# Patient Record
Sex: Female | Born: 1965 | Race: White | Hispanic: No | Marital: Married | Smoking: Never smoker
Health system: Southern US, Community
[De-identification: ages and names within clinical notes are randomized; demographics above are authoritative.]

## PROBLEM LIST (undated history)

## (undated) DIAGNOSIS — F419 Anxiety disorder, unspecified: Secondary | ICD-10-CM

## (undated) DIAGNOSIS — R7303 Prediabetes: Secondary | ICD-10-CM

## (undated) HISTORY — PX: TONSILLECTOMY: SUR1361

## (undated) HISTORY — PX: ADENOIDECTOMY: SUR15

## (undated) HISTORY — DX: Prediabetes: R73.03

## (undated) HISTORY — DX: Anxiety disorder, unspecified: F41.9

---

## 2017-11-21 DIAGNOSIS — L821 Other seborrheic keratosis: Secondary | ICD-10-CM | POA: Diagnosis not present

## 2017-11-21 DIAGNOSIS — L738 Other specified follicular disorders: Secondary | ICD-10-CM | POA: Diagnosis not present

## 2017-11-21 DIAGNOSIS — D2272 Melanocytic nevi of left lower limb, including hip: Secondary | ICD-10-CM | POA: Diagnosis not present

## 2017-11-21 DIAGNOSIS — D2262 Melanocytic nevi of left upper limb, including shoulder: Secondary | ICD-10-CM | POA: Diagnosis not present

## 2017-11-21 DIAGNOSIS — D492 Neoplasm of unspecified behavior of bone, soft tissue, and skin: Secondary | ICD-10-CM | POA: Diagnosis not present

## 2017-11-21 DIAGNOSIS — L570 Actinic keratosis: Secondary | ICD-10-CM | POA: Diagnosis not present

## 2017-12-17 DIAGNOSIS — F411 Generalized anxiety disorder: Secondary | ICD-10-CM | POA: Diagnosis not present

## 2017-12-17 DIAGNOSIS — J111 Influenza due to unidentified influenza virus with other respiratory manifestations: Secondary | ICD-10-CM | POA: Diagnosis not present

## 2018-04-11 DIAGNOSIS — J0111 Acute recurrent frontal sinusitis: Secondary | ICD-10-CM | POA: Diagnosis not present

## 2019-04-08 DIAGNOSIS — J302 Other seasonal allergic rhinitis: Secondary | ICD-10-CM | POA: Diagnosis not present

## 2019-04-08 DIAGNOSIS — F411 Generalized anxiety disorder: Secondary | ICD-10-CM | POA: Diagnosis not present

## 2019-04-21 DIAGNOSIS — Z1231 Encounter for screening mammogram for malignant neoplasm of breast: Secondary | ICD-10-CM | POA: Diagnosis not present

## 2019-04-21 DIAGNOSIS — Z0000 Encounter for general adult medical examination without abnormal findings: Secondary | ICD-10-CM | POA: Diagnosis not present

## 2019-04-21 DIAGNOSIS — Z124 Encounter for screening for malignant neoplasm of cervix: Secondary | ICD-10-CM | POA: Diagnosis not present

## 2019-04-21 DIAGNOSIS — Z6839 Body mass index (BMI) 39.0-39.9, adult: Secondary | ICD-10-CM | POA: Diagnosis not present

## 2019-04-21 DIAGNOSIS — Z3009 Encounter for other general counseling and advice on contraception: Secondary | ICD-10-CM | POA: Diagnosis not present

## 2019-04-21 DIAGNOSIS — Z01419 Encounter for gynecological examination (general) (routine) without abnormal findings: Secondary | ICD-10-CM | POA: Diagnosis not present

## 2019-05-08 DIAGNOSIS — Z01812 Encounter for preprocedural laboratory examination: Secondary | ICD-10-CM | POA: Diagnosis not present

## 2019-05-08 DIAGNOSIS — Z3202 Encounter for pregnancy test, result negative: Secondary | ICD-10-CM | POA: Diagnosis not present

## 2019-05-08 DIAGNOSIS — Z30430 Encounter for insertion of intrauterine contraceptive device: Secondary | ICD-10-CM | POA: Diagnosis not present

## 2019-05-08 DIAGNOSIS — Z113 Encounter for screening for infections with a predominantly sexual mode of transmission: Secondary | ICD-10-CM | POA: Diagnosis not present

## 2019-06-19 DIAGNOSIS — Z30431 Encounter for routine checking of intrauterine contraceptive device: Secondary | ICD-10-CM | POA: Diagnosis not present

## 2019-06-19 DIAGNOSIS — Z975 Presence of (intrauterine) contraceptive device: Secondary | ICD-10-CM | POA: Diagnosis not present

## 2019-06-19 DIAGNOSIS — N921 Excessive and frequent menstruation with irregular cycle: Secondary | ICD-10-CM | POA: Diagnosis not present

## 2019-06-24 DIAGNOSIS — N3946 Mixed incontinence: Secondary | ICD-10-CM | POA: Diagnosis not present

## 2019-07-16 ENCOUNTER — Other Ambulatory Visit: Payer: Self-pay

## 2019-07-16 DIAGNOSIS — Z20822 Contact with and (suspected) exposure to covid-19: Secondary | ICD-10-CM

## 2019-07-17 LAB — NOVEL CORONAVIRUS, NAA: SARS-CoV-2, NAA: NOT DETECTED

## 2019-10-15 ENCOUNTER — Encounter: Payer: Self-pay | Admitting: Family Medicine

## 2019-10-15 ENCOUNTER — Ambulatory Visit: Payer: BC Managed Care – PPO | Admitting: Family Medicine

## 2019-10-15 ENCOUNTER — Other Ambulatory Visit: Payer: Self-pay

## 2019-10-15 VITALS — BP 108/68 | HR 95 | Temp 96.2°F | Ht 66.5 in | Wt 244.4 lb

## 2019-10-15 DIAGNOSIS — Z23 Encounter for immunization: Secondary | ICD-10-CM | POA: Diagnosis not present

## 2019-10-15 DIAGNOSIS — F411 Generalized anxiety disorder: Secondary | ICD-10-CM | POA: Diagnosis not present

## 2019-10-15 DIAGNOSIS — J309 Allergic rhinitis, unspecified: Secondary | ICD-10-CM

## 2019-10-15 MED ORDER — FLUTICASONE PROPIONATE 50 MCG/ACT NA SUSP: 2.0000 | Freq: Every day | NASAL | 2 refills | Status: DC

## 2019-10-15 MED ORDER — VENLAFAXINE HCL ER 75 MG PO CP24: 225.0000 mg | ORAL_CAPSULE | Freq: Every day | ORAL | 2 refills | Status: DC

## 2019-10-15 MED ORDER — KETOTIFEN FUMARATE 0.025 % OP SOLN: 1.0000 [drp] | Freq: Two times a day (BID) | OPHTHALMIC | 2 refills | Status: DC

## 2019-10-15 NOTE — Progress Notes (Signed)
Chief Complaint  Patient presents with  . New Patient (Initial Visit)  . Eye Drainage    mouth and nose burns       New Patient Visit SUBJECTIVE: HPI: Darlene Odonnell is an 52 y.o.female who is being seen for establishing care.  The patient was previously seen at [facility] in [place].  Pt has a hx of GAD. She takes Effexor 225 mg/d. Tolerates well, reports compliance. Stressors seem to be associated with her job. She will think about coming off in the next 3-4 years when she retires. No SI or HI.   2 mo of burning in eyes, nose and sometimes mouth. Associated itchy eyes, posterior drainage from nose and dryness. She does take an antihistamine daily, Xyzal typically works best. She will have associated popping in her ears, but not pain or drainage. No sinus pain, but sometimes upper dental pain. Denies sob/wheezing or fevers.   Past Medical History:  Diagnosis Date  . Anxiety    Past Surgical History:  Procedure Laterality Date  . CESAREAN SECTION  09/30/1993  . CESAREAN SECTION  07/23/1997   Family History  Problem Relation Age of Onset  . Diabetes Mother   . Hypertension Mother   . Diabetes Father    No Known Allergies  Current Outpatient Medications:  .  levocetirizine (XYZAL) 5 MG tablet, Take 5 mg by mouth every evening., Disp: , Rfl:  .  venlafaxine XR (EFFEXOR-XR) 75 MG 24 hr capsule, Take 3 capsules (225 mg total) by mouth daily with breakfast., Disp: 270 capsule, Rfl: 2 .  fluticasone (FLONASE) 50 MCG/ACT nasal spray, Place 2 sprays into both nostrils daily., Disp: 16 g, Rfl: 2 .  ketotifen (ZADITOR) 0.025 % ophthalmic solution, Place 1 drop into both eyes 2 (two) times daily., Disp: 5 mL, Rfl: 2  ROS Const: Denies fevers  Respiratory: Denies dyspnea   OBJECTIVE: BP 108/68 (BP Location: Left Arm, Patient Position: Sitting, Cuff Size: Large)   Pulse 95   Temp (!) 96.2 F (35.7 C) (Temporal)   Ht 5' 6.5" (1.689 m)   Wt 244 lb 6 oz (110.8 kg)   SpO2 94%   BMI  38.85 kg/m  General:  well developed, well nourished, in no apparent distress Skin:  no significant moles, warts, or growths Head:  no masses, lesions, or tenderness Eyes:  pupils equal and round, sclera anicteric without injection Ears:  canals without lesions, TMs shiny without retraction, no obvious effusion, no erythema Nose:  nares patent, septum midline, mucosa normal Throat/Pharynx:  lips and gingiva without lesion; tongue and uvula midline; non-inflamed pharynx, there is some hyperemia of soft palate; no exudates or postnasal drainage Neck: neck supple without adenopathy, thyromegaly, or masses Lungs:  clear to auscultation, breath sounds equal bilaterally, no respiratory distress Cardio:  regular rate and rhythm, no LE edema or bruits Neuro:  gait normal; deep tendon reflexes normal and symmetric Psych: well oriented with normal range of affect and appropriate judgment/insight  ASSESSMENT/PLAN: GAD (generalized anxiety disorder) - Plan: venlafaxine XR (EFFEXOR-XR) 75 MG 24 hr capsule  Allergic rhinitis, unspecified seasonality, unspecified trigger - Plan: levocetirizine (XYZAL) 5 MG tablet, fluticasone (FLONASE) 50 MCG/ACT nasal spray, ketotifen (ZADITOR) 0.025 % ophthalmic solution  Need for influenza vaccination - Plan: Flu Vaccine QUAD 6+ mos PF IM (Fluarix Quad PF)  Patient instructed to sign release of records form from her previous PCP. 1-Cont Effexor 2- Cont Xyzal. Add INCS. Add antihistamine drops. Patient should return in 6 mo for CPE or prn.  The patient voiced understanding and agreement to the plan.   [name] Solomon, DO 10/15/19  12:51 PM

## 2019-10-15 NOTE — Patient Instructions (Signed)
Consider over the counter allergy eye drops or artificial tears.  Stay on the [place].   Let us know if you need anything.

## 2019-12-15 ENCOUNTER — Ambulatory Visit (INDEPENDENT_AMBULATORY_CARE_PROVIDER_SITE_OTHER): Payer: BC Managed Care – PPO | Admitting: Family

## 2019-12-15 ENCOUNTER — Encounter: Payer: Self-pay | Admitting: Family

## 2019-12-15 VITALS — Temp 98.7°F

## 2019-12-15 DIAGNOSIS — J329 Chronic sinusitis, unspecified: Secondary | ICD-10-CM

## 2019-12-15 DIAGNOSIS — J309 Allergic rhinitis, unspecified: Secondary | ICD-10-CM

## 2019-12-15 MED ORDER — AMOXICILLIN-POT CLAVULANATE 875-125 MG PO TABS: 1.0000 | ORAL_TABLET | Freq: Two times a day (BID) | ORAL | 0 refills | Status: DC

## 2019-12-15 MED ORDER — MONTELUKAST SODIUM 10 MG PO TABS: 10.0000 mg | ORAL_TABLET | Freq: Every day | ORAL | 3 refills | Status: AC

## 2019-12-15 NOTE — Progress Notes (Signed)
Virtual Visit via Video Note  I connected with Darlene Odonnell on 12/15/19 at  9:20 AM EST by a video enabled telemedicine application and verified that I am speaking with the correct person using two identifiers.  Location: Patient: home Provider: home   I discussed the limitations of evaluation and management by telemedicine and the availability of in person appointments. The patient expressed understanding and agreed to proceed.  History of Present Illness:  Patient is a 54 yr old female who presents with several symptoms. She reports that her nose burns, eyes burn, has frontal headache, post nasal drip.   +chills, no fever.  Mild sore throat occasionally that she attributes to drainage.  She denies loss of taste or smell. Denies significant cough.  She reports that this has been going on off/on since November.  She is a Oncologist.    She reports that she takes zyxal, occasional flonase, nasal saline.  Notes that nasal drainage is very thick.   Past Medical History:  Diagnosis Date  . Anxiety      Social History   Socioeconomic History  . Marital status: Married    Spouse name: Not on file  . Number of children: Not on file  . Years of education: Not on file  . Highest education level: Not on file  Occupational History  . Not on file  Tobacco Use  . Smoking status: Never Smoker  . Smokeless tobacco: Never Used  Substance and Sexual Activity  . Alcohol use: Yes  . Drug use: Never  . Sexual activity: Not on file  Other Topics Concern  . Not on file  Social History Narrative  . Not on file   Social Determinants of Health   Financial Resource Strain:   . Difficulty of Paying Living Expenses: Not on file  Food Insecurity:   . Worried About Charity fundraiser in the Last Year: Not on file  . Ran Out of Food in the Last Year: Not on file  Transportation Needs:   . Lack of Transportation (Medical): Not on file  . Lack of Transportation (Non-Medical): Not on  file  Physical Activity:   . Days of Exercise per Week: Not on file  . Minutes of Exercise per Session: Not on file  Stress:   . Feeling of Stress : Not on file  Social Connections:   . Frequency of Communication with Friends and Family: Not on file  . Frequency of Social Gatherings with Friends and Family: Not on file  . Attends Religious Services: Not on file  . Active Member of Clubs or Organizations: Not on file  . Attends Archivist Meetings: Not on file  . Marital Status: Not on file  Intimate Partner Violence:   . Fear of Current or Ex-Partner: Not on file  . Emotionally Abused: Not on file  . Physically Abused: Not on file  . Sexually Abused: Not on file    Past Surgical History:  Procedure Laterality Date  . CESAREAN SECTION  09/30/1993  . CESAREAN SECTION  07/23/1997    Family History  Problem Relation Age of Onset  . Diabetes Mother   . Hypertension Mother   . Diabetes Father     No Known Allergies  Current Outpatient Medications on File Prior to Visit  Medication Sig Dispense Refill  . fluticasone (FLONASE) 50 MCG/ACT nasal spray Place 2 sprays into both nostrils daily. 16 g 2  . ketotifen (ZADITOR) 0.025 % ophthalmic solution Place 1 drop  into both eyes 2 (two) times daily. 5 mL 2  . levocetirizine (XYZAL) 5 MG tablet Take 5 mg by mouth every evening.    . venlafaxine XR (EFFEXOR-XR) 75 MG 24 hr capsule Take 3 capsules (225 mg total) by mouth daily with breakfast. 270 capsule 2   No current facility-administered medications on file prior to visit.    Temp 98.7 F (37.1 C) (Tympanic)      Observations/Objective:   Gen: Awake, alert, no acute distress Resp: Breathing is even and non-labored Psych: calm/pleasant demeanor Neuro: Alert and Oriented x 3, + facial symmetry, speech is clear.   Assessment and Plan:  Allergic rhinitis-uncontrolled.  Continue Xyzal, and Flonase.  Recommended that she add trial of Singulair.  Sinusitis-we will  treat with Augmentin.She is advised to let us know if symptoms worsen or fail to improve.  Follow-up with PCP in 1 month  Follow Up Instructions:    I discussed the assessment and treatment plan with the patient. The patient was provided an opportunity to ask questions and all were answered. The patient agreed with the plan and demonstrated an understanding of the instructions.   The patient was advised to call back or seek an in-person evaluation if the symptoms worsen or if the condition fails to improve as anticipated.  Lemont Fillers, NP

## 2019-12-31 ENCOUNTER — Encounter: Payer: Self-pay | Admitting: Family Medicine

## 2019-12-31 ENCOUNTER — Other Ambulatory Visit: Payer: Self-pay

## 2019-12-31 ENCOUNTER — Ambulatory Visit (INDEPENDENT_AMBULATORY_CARE_PROVIDER_SITE_OTHER): Payer: BC Managed Care – PPO | Admitting: Family Medicine

## 2019-12-31 DIAGNOSIS — J40 Bronchitis, not specified as acute or chronic: Secondary | ICD-10-CM | POA: Diagnosis not present

## 2019-12-31 DIAGNOSIS — U071 COVID-19: Secondary | ICD-10-CM

## 2019-12-31 MED ORDER — ALBUTEROL SULFATE HFA 108 (90 BASE) MCG/ACT IN AERS: 2.0000 | INHALATION_SPRAY | Freq: Four times a day (QID) | RESPIRATORY_TRACT | 2 refills | Status: AC | PRN

## 2019-12-31 MED ORDER — PROMETHAZINE-DM 6.25-15 MG/5ML PO SYRP: 5.0000 mL | ORAL_SOLUTION | Freq: Four times a day (QID) | ORAL | 0 refills | Status: DC | PRN

## 2019-12-31 MED ORDER — AZITHROMYCIN 250 MG PO TABS: ORAL_TABLET | ORAL | 0 refills | Status: DC

## 2019-12-31 NOTE — Progress Notes (Signed)
Virtual Visit via Video Note  I connected with Darlene Odonnell on 12/31/19 at  8:20 AM EST by a video enabled telemedicine application and verified that I am speaking with the correct person using two identifiers.  Location: Patient: home  Provider: home    I discussed the limitations of evaluation and management by telemedicine and the availability of in person appointments. The patient expressed understanding and agreed to proceed.  History of Present Illness: Pt is home c/o cough and chest congestion and sob and heaviness in chest   She just finished the augmentin for sinusitis    Past Medical History:  Diagnosis Date  . Anxiety    Current Outpatient Medications on File Prior to Visit  Medication Sig Dispense Refill  . fluticasone (FLONASE) 50 MCG/ACT nasal spray Place 2 sprays into both nostrils daily. 16 g 2  . ketotifen (ZADITOR) 0.025 % ophthalmic solution Place 1 drop into both eyes 2 (two) times daily. 5 mL 2  . levocetirizine (XYZAL) 5 MG tablet Take 5 mg by mouth every evening.    . montelukast (SINGULAIR) 10 MG tablet Take 1 tablet (10 mg total) by mouth at bedtime. 30 tablet 3  . venlafaxine XR (EFFEXOR-XR) 75 MG 24 hr capsule Take 3 capsules (225 mg total) by mouth daily with breakfast. 270 capsule 2  . amoxicillin-clavulanate (AUGMENTIN) 875-125 MG tablet Take 1 tablet by mouth 2 (two) times daily. (Patient not taking: Reported on 12/31/2019) 20 tablet 0   No current facility-administered medications on file prior to visit.   No Known Allergies Social History   Socioeconomic History  . Marital status: Married    Spouse name: Not on file  . Number of children: Not on file  . Years of education: Not on file  . Highest education level: Not on file  Occupational History  . Not on file  Tobacco Use  . Smoking status: Never Smoker  . Smokeless tobacco: Never Used  Substance and Sexual Activity  . Alcohol use: Yes  . Drug use: Never  . Sexual activity: Not on file   Other Topics Concern  . Not on file  Social History Narrative  . Not on file   Social Determinants of Health   Financial Resource Strain:   . Difficulty of Paying Living Expenses: Not on file  Food Insecurity:   . Worried About Charity fundraiser in the Last Year: Not on file  . Ran Out of Food in the Last Year: Not on file  Transportation Needs:   . Lack of Transportation (Medical): Not on file  . Lack of Transportation (Non-Medical): Not on file  Physical Activity:   . Days of Exercise per Week: Not on file  . Minutes of Exercise per Session: Not on file  Stress:   . Feeling of Stress : Not on file  Social Connections:   . Frequency of Communication with Friends and Family: Not on file  . Frequency of Social Gatherings with Friends and Family: Not on file  . Attends Religious Services: Not on file  . Active Member of Clubs or Organizations: Not on file  . Attends Archivist Meetings: Not on file  . Marital Status: Not on file  Intimate Partner Violence:   . Fear of Current or Ex-Partner: Not on file  . Emotionally Abused: Not on file  . Physically Abused: Not on file  . Sexually Abused: Not on file   Observations/Objective: Unable to get vitals today She felt like she  had a fever last night   Assessment and Plan: 1. Bronchitis due to COVID-19 virus z pack, albuterol , cough med per orders con't singular , flonase and xyzal If not improving over the next few days we will schedule her in the resp clinic--- pt agrees with plan and will call back if she is not improving - azithromycin (ZITHROMAX Z-PAK) 250 MG tablet; As directed  Dispense: 6 each; Refill: 0 - albuterol (VENTOLIN HFA) 108 (90 Base) MCG/ACT inhaler; Inhale 2 puffs into the lungs every 6 (six) hours as needed for wheezing or shortness of breath.  Dispense: 18 g; Refill: 2 - promethazine-dextromethorphan (PROMETHAZINE-DM) 6.25-15 MG/5ML syrup; Take 5 mLs by mouth 4 (four) times daily as needed.   Dispense: 118 mL; Refill: 0   Follow Up Instructions:    I discussed the assessment and treatment plan with the patient. The patient was provided an opportunity to ask questions and all were answered. The patient agreed with the plan and demonstrated an understanding of the instructions.   The patient was advised to call back or seek an in-person evaluation if the symptoms worsen or if the condition fails to improve as anticipated.  I provided 30 minutes of non-face-to-face time during this encounter.--- reviewing chart / history    Donato Schultz, DO

## 2020-01-09 ENCOUNTER — Other Ambulatory Visit: Payer: Self-pay

## 2020-01-09 ENCOUNTER — Encounter (HOSPITAL_BASED_OUTPATIENT_CLINIC_OR_DEPARTMENT_OTHER): Payer: Self-pay | Admitting: *Deleted

## 2020-01-09 ENCOUNTER — Emergency Department (HOSPITAL_BASED_OUTPATIENT_CLINIC_OR_DEPARTMENT_OTHER)
Admission: EM | Admit: 2020-01-09 | Discharge: 2020-01-09 | Disposition: A | Discharge status: Home / Self Care | Payer: BC Managed Care – PPO | Attending: Emergency Medicine | Admitting: Emergency Medicine

## 2020-01-09 ENCOUNTER — Emergency Department (HOSPITAL_BASED_OUTPATIENT_CLINIC_OR_DEPARTMENT_OTHER): Payer: BC Managed Care – PPO

## 2020-01-09 DIAGNOSIS — R0789 Other chest pain: Secondary | ICD-10-CM | POA: Diagnosis not present

## 2020-01-09 DIAGNOSIS — R071 Chest pain on breathing: Secondary | ICD-10-CM | POA: Insufficient documentation

## 2020-01-09 DIAGNOSIS — R05 Cough: Secondary | ICD-10-CM | POA: Diagnosis not present

## 2020-01-09 DIAGNOSIS — U071 COVID-19: Secondary | ICD-10-CM | POA: Diagnosis not present

## 2020-01-09 DIAGNOSIS — R059 Cough, unspecified: Secondary | ICD-10-CM

## 2020-01-09 LAB — CBC
HCT: 41 % (ref 36.0–46.0)
Hemoglobin: 13 g/dL (ref 12.0–15.0)
MCH: 27.3 pg (ref 26.0–34.0)
MCHC: 31.7 g/dL (ref 30.0–36.0)
MCV: 86 fL (ref 80.0–100.0)
Platelets: 216 10*3/uL (ref 150–400)
RBC: 4.77 MIL/uL (ref 3.87–5.11)
RDW: 14.3 % (ref 11.5–15.5)
WBC: 4.4 10*3/uL (ref 4.0–10.5)
nRBC: 0 % (ref 0.0–0.2)

## 2020-01-09 LAB — BASIC METABOLIC PANEL
Anion gap: 6 (ref 5–15)
BUN: 15 mg/dL (ref 6–20)
CO2: 26 mmol/L (ref 22–32)
Calcium: 8.4 mg/dL — ABNORMAL LOW (ref 8.9–10.3)
Chloride: 104 mmol/L (ref 98–111)
Creatinine, Ser: 0.72 mg/dL (ref 0.44–1.00)
GFR calc Af Amer: >60 mL/min (ref >60)
GFR calc non Af Amer: >60 mL/min (ref >60)
Glucose, Bld: 111 mg/dL — ABNORMAL HIGH (ref 70–99)
Potassium: 3.7 mmol/L (ref 3.5–5.1)
Sodium: 136 mmol/L (ref 135–145)

## 2020-01-09 LAB — TROPONIN I (HIGH SENSITIVITY)
Troponin I (High Sensitivity): 3 ng/L (ref <18)
Troponin I (High Sensitivity): 2 ng/L (ref <18)

## 2020-01-09 LAB — D-DIMER, QUANTITATIVE: D-Dimer, Quant: 0.33 ug/mL-FEU (ref 0.00–0.50)

## 2020-01-09 MED ORDER — BENZONATATE 100 MG PO CAPS: 100.0000 mg | ORAL_CAPSULE | Freq: Three times a day (TID) | ORAL | 0 refills | Status: DC

## 2020-01-09 MED FILL — BENZONATATE 100 MG CAPS: 100 | 7 days supply | Qty: 21 | Fill #0

## 2020-01-09 NOTE — ED Triage Notes (Signed)
Covid positive x 10 days. States she still feels weak and has a cough. Painful to take a deep breath.

## 2020-01-09 NOTE — ED Notes (Signed)
Pt maintained O2 98-100% during ambulation.

## 2020-01-09 NOTE — Discharge Instructions (Addendum)
You have been diagnosed today with COVID-19 viral infection, chest wall pain.  At this time there does not appear to be the presence of an emergent medical condition, however there is always the potential for conditions to change. Please read and follow the below instructions.  Please return to the Emergency Department immediately for any new or worsening symptoms. Please be sure to follow up with your Primary Care Provider within one week regarding your visit today; please call their office to schedule an appointment even if you are feeling better for a follow-up visit. You may take the medication Tessalon as prescribed to help with your cough.  Please drink plenty of water and get plenty of rest.  Get help right away if: You have trouble breathing. You have pain or pressure in your chest. You have confusion. You have bluish lips and fingernails. You have difficulty waking from sleep. Get help right away if: Your chest pain is worse. You have a cough that gets worse, or you cough up blood. You have very bad (severe) pain in your belly (abdomen). You pass out (faint). You have either of these for no clear reason: Sudden chest discomfort. Sudden discomfort in your arms, back, neck, or jaw. You have shortness of breath at any time. You suddenly start to sweat, or your skin gets clammy. You feel sick to your stomach (nauseous). You throw up (vomit). You suddenly feel lightheaded or dizzy. You feel very weak or tired. Your heart starts to beat fast, or it feels like it is skipping beats. You have any new/concerning or worsening of symptoms  Please read the additional information packets attached to your discharge summary.  Do not take your medicine if  develop an itchy rash, swelling in your mouth or lips, or difficulty breathing; call 911 and seek immediate emergency medical attention if this occurs.  Note: Portions of this text may have been transcribed using voice recognition  software. Every effort was made to ensure accuracy; however, inadvertent computerized transcription errors may still be present.

## 2020-01-09 NOTE — ED Provider Notes (Signed)
[facility] EMERGENCY DEPARTMENT Provider Note   CSN: [id number] Arrival date & time: 01/09/20  1138     History Chief Complaint  Patient presents with   + Covid    Darlene Odonnell is a 54 y.o. female history of anxiety otherwise healthy no daily medication use.  Patient reports that she tested positive for COVID-19 at [facility] 10 days ago.  She was tested because she had developed a mild nonproductive cough and mild body aches that same day.  She reports that since onset of illness 10 days ago her symptoms have gradually worsened.  She reports a continued mild cough with minimal white sputum production.  She describes generalized body aches as a mild diffuse aching sensation to her muscles nonradiating constant without aggravating or alleviating factors or focal pain.  She reports generalized fatigue without unilateral weakness.  Additionally she reports 2 days ago she developed a mild central chest pain as a sharp sensation worsened with coughing and taking deep breath nonradiating improved when she stops coughing and intermittent.  Additionally patient reports that during the first few days of her illness she had mild nonbloody diarrhea which has resolved.  She denies any recent fever/chills, denies headache, vision changes, neck stiffness, hemoptysis, abdominal pain, nausea/vomiting, extremity swelling/color change or any additional concerns.  HPI     Past Medical History:  Diagnosis Date   Anxiety     Patient Active Problem List   Diagnosis Date Noted   GAD (generalized anxiety disorder) 10/15/2019    Past Surgical History:  Procedure Laterality Date   CESAREAN SECTION  09/30/1993   CESAREAN SECTION  07/23/1997     OB History   No obstetric history on file.     Family History  Problem Relation Age of Onset   Diabetes Mother    Hypertension Mother    Diabetes Father     Social History   Tobacco Use   Smoking status: Never Smoker   Smokeless  tobacco: Never Used  Substance Use Topics   Alcohol use: Yes   Drug use: Never    Home Medications Prior to Admission medications   Medication Sig Start Date End Date Taking? Authorizing Provider  albuterol (VENTOLIN HFA) 108 (90 Base) MCG/ACT inhaler Inhale 2 puffs into the lungs every 6 (six) hours as needed for wheezing or shortness of breath. 12/31/19   Donato Schultz, DO  amoxicillin-clavulanate (AUGMENTIN) 875-125 MG tablet Take 1 tablet by mouth 2 (two) times daily. Patient not taking: Reported on 12/31/2019 12/15/19   Sandford Craze, NP  azithromycin (ZITHROMAX Z-PAK) 250 MG tablet As directed 12/31/19   Zola Button, Grayling Congress, DO  benzonatate (TESSALON) 100 MG capsule Take 1 capsule (100 mg total) by mouth every 8 (eight) hours. 01/09/20   Harlene Salts A, PA-C  fluticasone (FLONASE) 50 MCG/ACT nasal spray Place 2 sprays into both nostrils daily. 10/15/19   Sharlene Dory, DO  ketotifen (ZADITOR) 0.025 % ophthalmic solution Place 1 drop into both eyes 2 (two) times daily. 10/15/19   Sharlene Dory, DO  levocetirizine (XYZAL) 5 MG tablet Take 5 mg by mouth every evening.    [provider]  montelukast (SINGULAIR) 10 MG tablet Take 1 tablet (10 mg total) by mouth at bedtime. 12/15/19   Sandford Craze, NP  promethazine-dextromethorphan (PROMETHAZINE-DM) 6.25-15 MG/5ML syrup Take 5 mLs by mouth 4 (four) times daily as needed. 12/31/19   Seabron Spates R, DO  venlafaxine XR (EFFEXOR-XR) 75 MG 24 hr capsule  Take 3 capsules (225 mg total) by mouth daily with breakfast. 10/15/19   Sharlene Dory, DO    Allergies    Patient has no known allergies.  Review of Systems   Review of Systems Ten systems are reviewed and are negative for acute change except as noted in the HPI  Physical Exam Updated Vital Signs BP 119/72    Pulse 86    Temp 98.2 F (36.8 C) (Oral)    Resp 18    Ht 5' 6.5" (1.689 m)    Wt 108.9 kg    SpO2 100%    BMI  38.16 kg/m   Physical Exam Constitutional:      General: She is not in acute distress.    Appearance: Normal appearance. She is well-developed. She is not ill-appearing or diaphoretic.  HENT:     Head: Normocephalic and atraumatic.     Right Ear: External ear normal.     Left Ear: External ear normal.     Nose: Nose normal.     Mouth/Throat:     Mouth: Mucous membranes are moist.     Pharynx: Oropharynx is clear.  Eyes:     General: Vision grossly intact. Gaze aligned appropriately.     Pupils: Pupils are equal, round, and reactive to light.  Neck:     Trachea: Trachea and phonation normal. No tracheal deviation.  Cardiovascular:     Rate and Rhythm: Normal rate and regular rhythm.  Pulmonary:     Effort: Pulmonary effort is normal. No respiratory distress.     Breath sounds: Normal breath sounds.  Chest:     Chest wall: Tenderness present. No deformity or crepitus.  Abdominal:     General: There is no distension.     Palpations: Abdomen is soft.     Tenderness: There is no abdominal tenderness. There is no guarding or rebound.  Musculoskeletal:        General: Normal range of motion.     Cervical back: Normal range of motion.     Right lower leg: No edema.     Left lower leg: No edema.  Skin:    General: Skin is warm and dry.  Neurological:     Mental Status: She is alert.     GCS: GCS eye subscore is 4. GCS verbal subscore is 5. GCS motor subscore is 6.     Comments: Speech is clear and goal oriented, follows commands Major Cranial nerves without deficit, no facial droop Moves extremities without ataxia, coordination intact  Psychiatric:        Behavior: Behavior normal.     ED Results / Procedures / Treatments   Labs (all labs ordered are listed, but only abnormal results are displayed) Labs Reviewed  BASIC METABOLIC PANEL - Abnormal; Notable for the following components:      Result Value   Glucose, Bld 111 (*)    Calcium 8.4 (*)    All other components  within normal limits  CBC  D-DIMER, QUANTITATIVE (NOT AT [facility])  TROPONIN I (HIGH SENSITIVITY)  TROPONIN I (HIGH SENSITIVITY)    EKG EKG Interpretation  Date/Time:  Friday January 09 2020 13:27:13 EST Ventricular Rate:  95 PR Interval:    QRS Duration: 89 QT Interval:  346 QTC Calculation: 435 R Axis:   53 Text Interpretation: Sinus rhythm Low voltage, precordial leads Baseline wander in lead(s) V3 Confirmed by Jacalyn Lefevre [phone]) on 01/09/2020 4:34:45 PM   Radiology DG Chest Portable 1 View  Result Date: 01/09/2020 CLINICAL DATA:  Chest tightness, COVID EXAM: PORTABLE CHEST 1 VIEW COMPARISON:  None. FINDINGS: Lungs are clear.  No pleural effusion or pneumothorax. The heart is normal in size. IMPRESSION: No evidence of acute cardiopulmonary disease. Electronically Signed   By: Julian Hy M.D.   On: 01/09/2020 12:54    Procedures Procedures (including critical care time)  Medications Ordered in ED Medications - No data to display  ED Course  I have reviewed the triage vital signs and the nursing notes.  Pertinent labs & imaging results that were available during my care of the patient were reviewed by me and considered in my medical decision making (see chart for details).    MDM Rules/Calculators/A&P                      54 year old female tested [facility], symptomatic for 10 days as well.  She presents today for chest pain which is reproducible on palpation the chest wall and worsened with cough ongoing x2 days.  On examination she is well-appearing and in no acute distress.  Cranial nerves intact, no meningeal signs, heart regular rate and rhythm, lungs clear to auscultation bilaterally, abdomen soft nontender without peritoneal signs and she is neurovascular intact to all 4 extremities without evidence of DVT.  Unfortunately cannot apply PERC criteria due to age, will obtain basic chest pain labs and a D-dimer as well.  Low  suspicion for PE at this time as patient is without tachycardia or hypoxia on room air. - CBC within normal limits, no signs of anemia or leukocytosis to suggest infection BMP without significant electrolyte abnormalities, edition no evidence of kidney injury D-dimer within normal limits, supports low suspicion for pulmonary embolism High-sensitivity troponin within normal limits x2, doubt ACS Chest x-ray:    IMPRESSION:  No evidence of acute cardiopulmonary disease.   I have personally reviewed patient's chest x-ray and agree with radiologist interpretation.  EKG: Sinus rhythm Low voltage, precordial leads Baseline wander in lead(s) V3 Confirmed by Isla Pence [phone]) on 01/09/2020 4:34:45 PM - Based on history, physical examination and work-up above aspect patient's sharp chest pain is secondary to musculoskeletal pain due to her cough.  She was ambulated by nursing staff and had no hypoxia or difficulty on room air.  There is no indication for admission or further work-up at this time.  No evidence of ACS, PE, dissection or other acute cardiopulmonary etiologies of her symptoms today.  Additionally no evidence to support a bacterial pneumonia requiring antibiotics at this time.  We will treat patient's cough, encourage home symptomatic treatments and PCP follow-up.  5:20 PM: Patient reevaluated resting comfortably using her phone.  Vital signs stable on room air without tachycardia or hypoxia.  She states understanding of work-up above and has no further questions, she reports that she is feeling well at this time.  She is agreeable to discharge with Ladona Ridgel and PCP follow-up.  I encouraged her to call their office to schedule a follow-up visit next week.  She is asking for a work note which has been provided.  At this time there does not appear to be any evidence of an acute emergency medical condition and the patient appears stable for discharge with appropriate outpatient follow  up. Diagnosis was discussed with patient who verbalizes understanding of care plan and is agreeable to discharge. I have discussed return precautions with patient who verbalizes understanding of return precautions.  Patient encouraged to follow-up with their PCP. All questions answered.  Patient's case discussed with Dr. Particia Nearing who agrees with plan to discharge with follow-up.   Briah Nary was evaluated in Emergency Department on 01/09/2020 for the symptoms described in the history of present illness. She was evaluated in the context of the global COVID-19 pandemic, which necessitated consideration that the patient might be at risk for infection with the SARS-CoV-2 virus that causes COVID-19. Institutional protocols and algorithms that pertain to the evaluation of patients at risk for COVID-19 are in a state of rapid change based on information released by regulatory bodies including the CDC and federal and state organizations. These policies and algorithms were followed during the patient's care in the ED.  Note: Portions of this report may have been transcribed using voice recognition software. Every effort was made to ensure accuracy; however, inadvertent computerized transcription errors may still be present. Final Clinical Impression(s) / ED Diagnoses Final diagnoses:  COVID-19 virus infection  Cough  Chest wall pain    Rx / DC Orders ED Discharge Orders         Ordered    benzonatate (TESSALON) 100 MG capsule  Every 8 hours     01/09/20 1728           Elizabeth Palau 01/09/20 1734    Jacalyn Lefevre, MD 01/09/20 1840

## 2020-03-18 DIAGNOSIS — T8332XA Displacement of intrauterine contraceptive device, initial encounter: Secondary | ICD-10-CM | POA: Diagnosis not present

## 2020-03-31 DIAGNOSIS — Z20828 Contact with and (suspected) exposure to other viral communicable diseases: Secondary | ICD-10-CM | POA: Diagnosis not present

## 2020-03-31 DIAGNOSIS — J0140 Acute pansinusitis, unspecified: Secondary | ICD-10-CM | POA: Diagnosis not present

## 2020-03-31 DIAGNOSIS — R05 Cough: Secondary | ICD-10-CM | POA: Diagnosis not present

## 2020-03-31 DIAGNOSIS — Z03818 Encounter for observation for suspected exposure to other biological agents ruled out: Secondary | ICD-10-CM | POA: Diagnosis not present

## 2020-04-26 DIAGNOSIS — T8332XD Displacement of intrauterine contraceptive device, subsequent encounter: Secondary | ICD-10-CM | POA: Diagnosis not present

## 2020-04-26 DIAGNOSIS — N921 Excessive and frequent menstruation with irregular cycle: Secondary | ICD-10-CM | POA: Diagnosis not present

## 2020-04-26 DIAGNOSIS — Z975 Presence of (intrauterine) contraceptive device: Secondary | ICD-10-CM | POA: Diagnosis not present

## 2020-04-26 DIAGNOSIS — T8332XA Displacement of intrauterine contraceptive device, initial encounter: Secondary | ICD-10-CM | POA: Diagnosis not present

## 2020-11-01 DIAGNOSIS — L821 Other seborrheic keratosis: Secondary | ICD-10-CM | POA: Diagnosis not present

## 2020-11-01 DIAGNOSIS — L820 Inflamed seborrheic keratosis: Secondary | ICD-10-CM | POA: Diagnosis not present

## 2020-11-01 DIAGNOSIS — D235 Other benign neoplasm of skin of trunk: Secondary | ICD-10-CM | POA: Diagnosis not present

## 2020-11-01 DIAGNOSIS — L281 Prurigo nodularis: Secondary | ICD-10-CM | POA: Diagnosis not present

## 2020-11-11 DIAGNOSIS — Z20822 Contact with and (suspected) exposure to covid-19: Secondary | ICD-10-CM | POA: Diagnosis not present

## 2020-12-02 ENCOUNTER — Telehealth: Payer: Self-pay | Admitting: Family Medicine

## 2020-12-02 DIAGNOSIS — F411 Generalized anxiety disorder: Secondary | ICD-10-CM

## 2020-12-02 MED ORDER — VENLAFAXINE HCL ER 75 MG PO CP24: 225.0000 mg | ORAL_CAPSULE | Freq: Every day | ORAL | 0 refills | Status: DC

## 2020-12-02 NOTE — Telephone Encounter (Signed)
Rx sent. Overdue for appt. Please schedule at her earliest convenience.

## 2020-12-02 NOTE — Telephone Encounter (Signed)
Medication: venlafaxine XR (EFFEXOR-XR) 75 MG 24 hr capsule [[id number]]       Has the patient contacted their pharmacy?  (If no, request that the patient contact the pharmacy for the refill.) (If yes, when and what did the pharmacy advise?)     Preferred Pharmacy (with phone number or street name): [id number] - [place], [place] - [address] AT [facility] [address]  [address], [place] [address]  Phone:  [phone] Fax:  [phone]      Agent: Please be advised that RX refills may take up to 3 business days. We ask that you follow-up with your pharmacy.

## 2020-12-19 ENCOUNTER — Other Ambulatory Visit: Payer: Self-pay | Admitting: Family Medicine

## 2020-12-19 DIAGNOSIS — F411 Generalized anxiety disorder: Secondary | ICD-10-CM

## 2020-12-22 ENCOUNTER — Ambulatory Visit: Payer: BC Managed Care – PPO | Admitting: Family Medicine

## 2020-12-31 ENCOUNTER — Encounter: Payer: Self-pay | Admitting: Family Medicine

## 2020-12-31 ENCOUNTER — Other Ambulatory Visit: Payer: Self-pay

## 2020-12-31 ENCOUNTER — Other Ambulatory Visit: Payer: Self-pay | Admitting: Family Medicine

## 2020-12-31 ENCOUNTER — Ambulatory Visit: Payer: BC Managed Care – PPO | Admitting: Family Medicine

## 2020-12-31 VITALS — BP 122/77 | HR 76 | Temp 98.6°F | Resp 18 | Wt 258.6 lb

## 2020-12-31 DIAGNOSIS — R0683 Snoring: Secondary | ICD-10-CM | POA: Diagnosis not present

## 2020-12-31 DIAGNOSIS — F411 Generalized anxiety disorder: Secondary | ICD-10-CM | POA: Diagnosis not present

## 2020-12-31 MED ORDER — VENLAFAXINE HCL ER 75 MG PO CP24: 225.0000 mg | ORAL_CAPSULE | Freq: Every day | ORAL | 2 refills | Status: DC

## 2020-12-31 MED ORDER — OZEMPIC (0.25 OR 0.5 MG/DOSE) 2 MG/1.5ML ~~LOC~~ SOPN: PEN_INJECTOR | SUBCUTANEOUS | 1 refills | Status: DC

## 2020-12-31 NOTE — Patient Instructions (Addendum)
Keep the diet clean and stay active.  Aim to do some physical exertion for 150 minutes per week. This is typically divided into 5 days per week, 30 minutes per day. The activity should be enough to get your heart rate up. Anything is better than nothing if you have time constraints.  Let me know if there are cost issues with the Ozempic (injection).   If you do not hear anything about your referral in the next 1-2 weeks, call our office and ask for an update.  Let us know if you need anything.

## 2020-12-31 NOTE — Progress Notes (Signed)
Chief Complaint  Patient presents with  . Medication Refill    Pt has noticed some weight gain d/t covid. She also says she feels bad and wants to explore why. She has cold feet sometime. She would like to know if she can take something to jumpstart weight loss    Subjective Darlene Odonnell presents for f/u anxiety.  Pt is currently being treated with Effexor XR 225 mg/d.  Reports doing OK since treatment. No thoughts of harming self or others. No self-medication with alcohol, prescription drugs or illicit drugs. Pt is not following with a counselor/psychologist.  Pt has gained around 30 lbs over the past year. This has been making her down. Her diet is fair. She is walking. She has done Weight Watchers in the past. She does snore and will wake up feeling poorly rested.   Past Medical History:  Diagnosis Date  . Anxiety    Allergies as of 12/31/2020      Reactions   Other    Other reaction(s): Unknown      Medication List       Accurate as of December 31, 2020  4:05 PM. If you have any questions, ask your nurse or doctor.        STOP taking these medications   amoxicillin-clavulanate 875-125 MG tablet Commonly known as: AUGMENTIN Stopped by: Sharlene Dory, DO   azithromycin 250 MG tablet Commonly known as: Zithromax Z-Pak Stopped by: Sharlene Dory, DO   benzonatate 100 MG capsule Commonly known as: TESSALON Stopped by: Sharlene Dory, DO   ketotifen 0.025 % ophthalmic solution Commonly known as: ZADITOR Stopped by: Sharlene Dory, DO   promethazine-dextromethorphan 6.25-15 MG/5ML syrup Commonly known as: PROMETHAZINE-DM Stopped by: Sharlene Dory, DO     TAKE these medications   albuterol 108 (90 Base) MCG/ACT inhaler Commonly known as: VENTOLIN HFA Inhale 2 puffs into the lungs every 6 (six) hours as needed for wheezing or shortness of breath.   fluticasone 50 MCG/ACT nasal spray Commonly known as: FLONASE Place 2  sprays into both nostrils daily.   levocetirizine 5 MG tablet Commonly known as: XYZAL Take 5 mg by mouth every evening.   Mirena (52 MG) 20 MCG/24HR IUD Generic drug: levonorgestrel by Intrauterine route.   montelukast 10 MG tablet Commonly known as: SINGULAIR Take 1 tablet (10 mg total) by mouth at bedtime.   Ozempic (0.25 or 0.5 MG/DOSE) 2 MG/1.5ML Sopn Generic drug: Semaglutide(0.25 or 0.5MG /DOS) Inject 0.25 mg into the skin once a week for 28 days, THEN 0.5 mg once a week for 28 days. Start taking on: December 31, 2020 Started by: Sharlene Dory, DO   venlafaxine XR 75 MG 24 hr capsule Commonly known as: EFFEXOR-XR Take 3 capsules (225 mg total) by mouth daily with breakfast.       Exam BP 122/77 (BP Location: Left Arm, Patient Position: Sitting, Cuff Size: Large)   Pulse 76   Temp 98.6 F (37 C) (Oral)   Resp 18   Wt 258 lb 9.6 oz (117.3 kg)   SpO2 98%   BMI 41.11 kg/m  General:  well developed, well nourished, in no apparent distress Heart: RRR Lungs:  CTAB. No respiratory distress Psych: well oriented with normal range of affect and age-appropriate judgement/insight, alert and oriented x4.  Assessment and Plan  GAD (generalized anxiety disorder) - Plan: venlafaxine XR (EFFEXOR-XR) 75 MG 24 hr capsule  Morbid obesity (HCC) - Plan: Semaglutide,0.25 or 0.5MG /DOS, (OZEMPIC, 0.25 OR 0.5  MG/DOSE,) 2 MG/1.5ML SOPN  Snoring - Plan: Ambulatory referral to Pulmonology  1. Cont Effexor XR 225 mg daily. 2. Trial Ozempic. She gets intermittent palpitations, will hold of on Adipex. Counseled on diet/exercise. 3. She probably has sleep apnea. Will refer to pulm.  F/u in 6 weeks for CPE. The patient voiced understanding and agreement to the plan.  Darlene Roche [place], DO 12/31/20 4:05 PM

## 2021-01-27 DIAGNOSIS — J0100 Acute maxillary sinusitis, unspecified: Secondary | ICD-10-CM | POA: Diagnosis not present

## 2021-02-11 ENCOUNTER — Encounter: Payer: Self-pay | Admitting: Family Medicine

## 2021-02-11 ENCOUNTER — Other Ambulatory Visit (INDEPENDENT_AMBULATORY_CARE_PROVIDER_SITE_OTHER): Payer: BC Managed Care – PPO

## 2021-02-11 ENCOUNTER — Ambulatory Visit (INDEPENDENT_AMBULATORY_CARE_PROVIDER_SITE_OTHER): Payer: BC Managed Care – PPO | Admitting: Family Medicine

## 2021-02-11 ENCOUNTER — Other Ambulatory Visit: Payer: Self-pay

## 2021-02-11 VITALS — BP 132/80 | HR 102 | Temp 98.5°F | Ht 67.0 in | Wt 257.0 lb

## 2021-02-11 DIAGNOSIS — R7303 Prediabetes: Secondary | ICD-10-CM | POA: Insufficient documentation

## 2021-02-11 DIAGNOSIS — Z1231 Encounter for screening mammogram for malignant neoplasm of breast: Secondary | ICD-10-CM | POA: Diagnosis not present

## 2021-02-11 DIAGNOSIS — J302 Other seasonal allergic rhinitis: Secondary | ICD-10-CM

## 2021-02-11 DIAGNOSIS — R739 Hyperglycemia, unspecified: Secondary | ICD-10-CM | POA: Diagnosis not present

## 2021-02-11 DIAGNOSIS — Z0000 Encounter for general adult medical examination without abnormal findings: Secondary | ICD-10-CM | POA: Diagnosis not present

## 2021-02-11 DIAGNOSIS — Z1211 Encounter for screening for malignant neoplasm of colon: Secondary | ICD-10-CM | POA: Diagnosis not present

## 2021-02-11 LAB — LIPID PANEL
Cholesterol: 169 mg/dL (ref 0–200)
HDL: 44.1 mg/dL (ref >39.00)
LDL Cholesterol: 107 mg/dL — ABNORMAL HIGH (ref 0–99)
NonHDL: 124.56
Total CHOL/HDL Ratio: 4
Triglycerides: 87 mg/dL (ref 0.0–149.0)
VLDL: 17.4 mg/dL (ref 0.0–40.0)

## 2021-02-11 LAB — CBC
HCT: 41.6 % (ref 36.0–46.0)
Hemoglobin: 13.5 g/dL (ref 12.0–15.0)
MCHC: 32.5 g/dL (ref 30.0–36.0)
MCV: 84.1 fl (ref 78.0–100.0)
Platelets: 251 10*3/uL (ref 150.0–400.0)
RBC: 4.95 Mil/uL (ref 3.87–5.11)
RDW: 14.6 % (ref 11.5–15.5)
WBC: 7.9 10*3/uL (ref 4.0–10.5)

## 2021-02-11 LAB — COMPREHENSIVE METABOLIC PANEL
ALT: 14 U/L (ref 0–35)
AST: 10 U/L (ref 0–37)
Albumin: 4.3 g/dL (ref 3.5–5.2)
Alkaline Phosphatase: 65 U/L (ref 39–117)
BUN: 13 mg/dL (ref 6–23)
CO2: 31 mEq/L (ref 19–32)
Calcium: 9.3 mg/dL (ref 8.4–10.5)
Chloride: 104 mEq/L (ref 96–112)
Creatinine, Ser: 0.75 mg/dL (ref 0.40–1.20)
GFR: 91.26 mL/min (ref >60.00)
Glucose, Bld: 119 mg/dL — ABNORMAL HIGH (ref 70–99)
Potassium: 4.5 mEq/L (ref 3.5–5.1)
Sodium: 141 mEq/L (ref 135–145)
Total Bilirubin: 0.5 mg/dL (ref 0.2–1.2)
Total Protein: 6.8 g/dL (ref 6.0–8.3)

## 2021-02-11 LAB — HEMOGLOBIN A1C: Hgb A1c MFr Bld: 6.2 % (ref 4.6–6.5)

## 2021-02-11 MED ORDER — METHYLPREDNISOLONE ACETATE 80 MG/ML IJ SUSP
80.0000 mg | Freq: Once | INTRAMUSCULAR | Status: AC
  Administered 2021-02-11: 80 mg via INTRAMUSCULAR

## 2021-02-11 NOTE — Progress Notes (Signed)
Chief Complaint  Patient presents with  . Annual Exam     Well Woman Darlene Odonnell is here for a complete physical.   Her last physical was >1 year ago.  Current diet: in general, diet could be better. Current exercise: walking. Weight is stable and she denies new fatigue out of ordinary.  Seatbelt? Yes Loss of interested in doing things or depression in past 2 weeks? No  Health Maintenance Pap/HPV- Yes Mammogram- No  Colon cancer screening-No Shingrix- No Tetanus- Yes Hep C screening- Yes HIV screening- Yes  Seasonal allergies Patient has a history of seasonal allergies.  She takes Singulair 10 mg daily, Xyzal 5 mg daily, and Flonase.  She is still having burning eyes, nasal complaints, and sometimes shortness of breath with activity.  She is wondering if she needs to see an allergist.  No fevers or sick contacts.  Past Medical History:  Diagnosis Date  . Anxiety      Past Surgical History:  Procedure Laterality Date  . CESAREAN SECTION  09/30/1993  . CESAREAN SECTION  07/23/1997    Medications  Current Outpatient Medications on File Prior to Visit  Medication Sig Dispense Refill  . albuterol (VENTOLIN HFA) 108 (90 Base) MCG/ACT inhaler Inhale 2 puffs into the lungs every 6 (six) hours as needed for wheezing or shortness of breath. 18 g 2  . fluticasone (FLONASE) 50 MCG/ACT nasal spray Place 2 sprays into both nostrils daily. 16 g 2  . levocetirizine (XYZAL) 5 MG tablet Take 5 mg by mouth every evening.    Marland Kitchen levonorgestrel (MIRENA, 52 MG,) 20 MCG/24HR IUD by Intrauterine route.    . montelukast (SINGULAIR) 10 MG tablet Take 1 tablet (10 mg total) by mouth at bedtime. 30 tablet 3  . Semaglutide,0.25 or 0.5MG /DOS, (OZEMPIC, 0.25 OR 0.5 MG/DOSE,) 2 MG/1.5ML SOPN Inject 0.25 mg into the skin once a week for 28 days, THEN 0.5 mg once a week for 28 days. 4.5 mL 1  . venlafaxine XR (EFFEXOR-XR) 75 MG 24 hr capsule Take 3 capsules (225 mg total) by mouth daily with breakfast.  270 capsule 2    Allergies Allergies  Allergen Reactions  . Other     Other reaction(s): Unknown    Review of Systems: Constitutional:  no unexpected weight changes Eye:  no recent significant change in vision Ear/Nose/Mouth/Throat:  Ears:  no recent change in hearing Nose/Mouth/Throat:  +runny nose, sinus congestion, burning around lips Cardiovascular: no chest pain Respiratory:  no current shortness of breath Gastrointestinal:  no abdominal pain, no change in bowel habits GU:  Female: negative for dysuria or pelvic pain Musculoskeletal/Extremities:  no pain of the joints Integumentary (Skin/Breast):  no abnormal skin lesions reported Neurologic:  no headaches Endocrine:  denies fatigue  Exam BP 132/80 (BP Location: Left Arm, Patient Position: Sitting, Cuff Size: Large)   Pulse (!) 102   Temp 98.5 F (36.9 C) (Oral)   Ht 5\' 7"  (1.702 m)   Wt 257 lb (116.6 kg)   SpO2 95%   BMI 40.25 kg/m  General:  well developed, well nourished, in no apparent distress Skin:  no significant moles, warts, or growths Head:  no masses, lesions, or tenderness Eyes:  pupils equal and round, sclera anicteric without injection Ears:  canals without lesions, TMs shiny without retraction, no obvious effusion, no erythema Nose:  nares patent, septum midline, mucosa normal, and no drainage or sinus tenderness Throat/Pharynx:  lips and gingiva without lesion; tongue and uvula midline; non-inflamed pharynx; no  exudates or postnasal drainage; there is hyperemia of the tonsillar pillars bilaterally Neck: neck supple without adenopathy, thyromegaly, or masses Lungs:  clear to auscultation, breath sounds equal bilaterally, no respiratory distress Cardio:  regular rate and rhythm, no LE edema Abdomen:  abdomen soft, nontender; bowel sounds normal; no masses or organomegaly Genital: Defer to GYN Musculoskeletal:  symmetrical muscle groups noted without atrophy or deformity Extremities:  no clubbing,  cyanosis, or edema, no deformities, no skin discoloration Neuro:  gait normal; deep tendon reflexes normal and symmetric Psych: well oriented with normal range of affect and appropriate judgment/insight  Assessment and Plan  Well adult exam - Plan: Comprehensive metabolic panel, Lipid panel, CBC  Encounter for screening mammogram for malignant neoplasm of breast - Plan: MM DIGITAL SCREENING BILATERAL  Screen for colon cancer - Plan: Ambulatory referral to Gastroenterology  Seasonal allergies - Plan: Ambulatory referral to Allergy, methylPREDNISolone acetate (DEPO-MEDROL) injection 80 mg   Well 55 y.o. female. Counseled on diet and exercise. Other orders as above. Depo injection today, continue Singulair 10 mg daily, Xyzal 5 mg daily, Flonase.  Refer to the allergy team. Follow up in 6 mo. The patient voiced understanding and agreement to the plan.  [name] [place], DO 02/11/21 9:36 AM

## 2021-02-11 NOTE — Patient Instructions (Addendum)
Give [place] 2-3 business days to get the results of your labs back.   Keep the diet clean and stay active.  If you do not hear anything about your referrals in the next 1-2 weeks, call our office and ask for an update.  Call your insurance company to see what weight loss medications they will cover.   The new Shingrix vaccine (for shingles) is a 2 shot series. It can make people feel low energy, achy and almost like they have the flu for 48 hours after injection. Please plan accordingly when deciding on when to get this shot. Call our office for a nurse visit appointment to get this. The second shot of the series is less severe regarding the side effects, but it still lasts 48 hours.   I do recommend you get the COVID-19 vaccination, either Pfizer or Moderna. Please don't hesitate to reach out if you have any questions.   Call Center for [facility] at [facility] at [phone] for an appointment.  They are located at [address], [place], [place], [address] (right across the hall from our office).  Let us know if you need anything.

## 2021-02-18 ENCOUNTER — Ambulatory Visit (HOSPITAL_BASED_OUTPATIENT_CLINIC_OR_DEPARTMENT_OTHER)
Admission: RE | Admit: 2021-02-18 | Discharge: 2021-02-18 | Disposition: A | Discharge status: Home / Self Care | Payer: BC Managed Care – PPO | Source: Ambulatory Visit | Attending: Family Medicine | Admitting: Family Medicine

## 2021-02-18 ENCOUNTER — Other Ambulatory Visit: Payer: Self-pay

## 2021-02-18 DIAGNOSIS — Z1231 Encounter for screening mammogram for malignant neoplasm of breast: Secondary | ICD-10-CM | POA: Diagnosis not present

## 2021-03-17 ENCOUNTER — Encounter: Payer: Self-pay | Admitting: Family

## 2021-03-17 ENCOUNTER — Ambulatory Visit: Payer: BC Managed Care – PPO | Admitting: Family

## 2021-03-17 ENCOUNTER — Other Ambulatory Visit: Payer: Self-pay

## 2021-03-17 VITALS — BP 110/76 | HR 68 | Temp 98.3°F | Ht 66.0 in | Wt 253.0 lb

## 2021-03-17 DIAGNOSIS — T7840XA Allergy, unspecified, initial encounter: Secondary | ICD-10-CM

## 2021-03-17 DIAGNOSIS — L03221 Cellulitis of neck: Secondary | ICD-10-CM | POA: Diagnosis not present

## 2021-03-17 MED ORDER — DOXYCYCLINE HYCLATE 100 MG PO TABS: 100.0000 mg | ORAL_TABLET | Freq: Two times a day (BID) | ORAL | 0 refills | Status: DC

## 2021-03-17 MED ORDER — TRIAMCINOLONE ACETONIDE 0.1 % EX CREA: 1.0000 "application " | TOPICAL_CREAM | Freq: Two times a day (BID) | CUTANEOUS | 0 refills | Status: DC

## 2021-03-17 MED ORDER — HYDROXYZINE HCL 10 MG PO TABS: 10.0000 mg | ORAL_TABLET | Freq: Three times a day (TID) | ORAL | 0 refills | Status: DC | PRN

## 2021-03-17 NOTE — Progress Notes (Signed)
Darlene Odonnell is a 55 y.o. female with the following history as recorded in [employer]:  Patient Active Problem List   Diagnosis Date Noted  . Prediabetes   . Morbid obesity (HCC) 12/31/2020  . GAD (generalized anxiety disorder) 10/15/2019    Current Outpatient Medications  Medication Sig Dispense Refill  . albuterol (VENTOLIN HFA) 108 (90 Base) MCG/ACT inhaler Inhale 2 puffs into the lungs every 6 (six) hours as needed for wheezing or shortness of breath. 18 g 2  . doxycycline (VIBRA-TABS) 100 MG tablet Take 1 tablet (100 mg total) by mouth 2 (two) times daily. 7 tablet 0  . fluticasone (FLONASE) 50 MCG/ACT nasal spray Place 2 sprays into both nostrils daily. 16 g 2  . hydrOXYzine (ATARAX/VISTARIL) 10 MG tablet Take 1 tablet (10 mg total) by mouth 3 (three) times daily as needed for itching. 30 tablet 0  . levocetirizine (XYZAL) 5 MG tablet Take 5 mg by mouth every evening.    [place] levonorgestrel (MIRENA, 52 MG,) 20 MCG/24HR IUD by Intrauterine route.    . triamcinolone cream (KENALOG) 0.1 % Apply 1 application topically 2 (two) times daily. 30 g 0  . venlafaxine XR (EFFEXOR-XR) 75 MG 24 hr capsule Take 3 capsules (225 mg total) by mouth daily with breakfast. 270 capsule 2  . montelukast (SINGULAIR) 10 MG tablet Take 1 tablet (10 mg total) by mouth at bedtime. (Patient not taking: Reported on 03/17/2021) 30 tablet 3   No current facility-administered medications for this visit.    Allergies: Other  Past Medical History:  Diagnosis Date  . Anxiety   . Prediabetes     Past Surgical History:  Procedure Laterality Date  . CESAREAN SECTION  09/30/1993  . CESAREAN SECTION  07/23/1997    Family History  Problem Relation Age of Onset  . Diabetes Mother   . Hypertension Mother   . Diabetes Father     Social History   Tobacco Use  . Smoking status: Never Smoker  . Smokeless tobacco: Never Used  Substance Use Topics  . Alcohol use: Yes    Subjective:   Presents with concerns for 1  day history of rash on neck; denies any new soaps, foods, detergents or medications.  Notes she was recently at [facility] in process of moving- has been cleaning out home/ packing boxes;    Objective:  Vitals:   03/17/21 0922  BP: 110/76  Pulse: 68  Temp: 98.3 F (36.8 C)  TempSrc: Oral  SpO2: 92%  Weight: 253 lb (114.8 kg)  Height: 5\' 6"  (1.676 m)    General: Well developed, well nourished, in no acute distress  Skin : Warm and dry. Localized area of erythema noted on left side of neck- puncture wound noted;  Head: Normocephalic and atraumatic  Eyes: Sclera and conjunctiva clear; pupils round and reactive to light; extraocular movements intact  Ears: External normal; canals clear; tympanic membranes normal  Oropharynx: Pink, supple. No suspicious lesions  Neck: Supple without thyromegaly, adenopathy  Lungs: Respirations unlabored;  Neurologic: Alert and oriented; speech intact; face symmetrical;   Assessment:  1. Cellulitis of neck   2. Allergic reaction, initial encounter     Plan:  Rx for Doxycyline 100 mg bid x 7 days; Rx for Atarax 10 mg- use this at night; okay to take Allegra during day; apply ice to affected area; Rx for Triamcinolone bid; follow-up worse, no better.  This visit occurred during the SARS-CoV-2 public health emergency.  Safety protocols were in place,  including screening questions prior to the visit, additional usage of staff PPE, and extensive cleaning of exam room while observing appropriate contact time as indicated for disinfecting solutions.     No follow-ups on file.  No orders of the defined types were placed in this encounter.   Requested Prescriptions   Signed Prescriptions Disp Refills  . doxycycline (VIBRA-TABS) 100 MG tablet 7 tablet 0    Sig: Take 1 tablet (100 mg total) by mouth 2 (two) times daily.  . hydrOXYzine (ATARAX/VISTARIL) 10 MG tablet 30 tablet 0    Sig: Take 1 tablet (10 mg total) by mouth 3 (three) times daily as  needed for itching.  . triamcinolone cream (KENALOG) 0.1 % 30 g 0    Sig: Apply 1 application topically 2 (two) times daily.

## 2021-04-04 ENCOUNTER — Other Ambulatory Visit: Payer: Self-pay

## 2021-04-04 ENCOUNTER — Encounter: Payer: Self-pay | Admitting: Allergy

## 2021-04-04 ENCOUNTER — Ambulatory Visit: Payer: BC Managed Care – PPO | Admitting: Allergy

## 2021-04-04 VITALS — BP 118/82 | HR 91 | Temp 97.7°F | Resp 18 | Ht 65.5 in | Wt 255.4 lb

## 2021-04-04 DIAGNOSIS — J3089 Other allergic rhinitis: Secondary | ICD-10-CM | POA: Diagnosis not present

## 2021-04-04 DIAGNOSIS — J4520 Mild intermittent asthma, uncomplicated: Secondary | ICD-10-CM | POA: Diagnosis not present

## 2021-04-04 MED ORDER — FLUTICASONE PROPIONATE 50 MCG/ACT NA SUSP: 1.0000 | Freq: Two times a day (BID) | NASAL | 5 refills | Status: AC | PRN

## 2021-04-04 MED ORDER — AZELASTINE HCL 0.1 % NA SOLN: 1.0000 | Freq: Two times a day (BID) | NASAL | 5 refills | Status: AC | PRN

## 2021-04-04 NOTE — Patient Instructions (Addendum)
Rhinitis:  . May use over the counter antihistamines such as Zyrtec (cetirizine), Claritin (loratadine), Allegra (fexofenadine), or Xyzal (levocetirizine) daily as needed. May take twice a day if needed during flares. . Continue Singulair (montelukast) 10mg  daily at night. . May use Flonase (fluticasone) nasal spray 1 spray per nostril twice a day as needed for nasal congestion.  . May use azelastine nasal spray 1-2 sprays per nostril twice a day as needed for runny nose/drainage. . Nasal saline spray (i.e., Simply Saline) or nasal saline lavage (i.e., NeilMed) is recommended as needed and prior to medicated nasal sprays. Keep track of infections.   . Get bloodwork:  o We are ordering labs, so please allow 1-2 weeks for the results to come back. o With the newly implemented Cures Act, the labs might be visible to you at the same time that they become visible to me. However, I will not address the results until all of the results are back, so please be patient.  o In the meantime, continue recommendations in your patient instructions, including avoidance measures (if applicable), until you hear from me.  Breathing:  Normal breathing test today.   May use albuterol rescue inhaler 2 puffs every 4 to 6 hours as needed for shortness of breath, chest tightness, coughing, and wheezing. May use albuterol rescue inhaler 2 puffs 5 to 15 minutes prior to strenuous physical activities. Monitor frequency of use.   Follow up in 2 months or sooner if needed.

## 2021-04-04 NOTE — Progress Notes (Signed)
New Patient Note  RE: Darlene Odonnell MRN: [id number] DOB: 1966/04/25 Date of Office Visit: 04/04/2021  Consult requested by: Sharlene Dory* Primary care provider: Sharlene Dory, DO  Chief Complaint: Allergic Rhinitis  (For years have been fighting sinus infections. Taking allergy medicatons. Mask causes irritation. Post nasal drip)  History of Present Illness: I had the pleasure of seeing Darlene Odonnell for initial evaluation at the [facility] on 04/05/2021. She is a 55 y.o. female, who is referred here by Sharlene Dory, DO for the evaluation of seasonal allergies.  She reports symptoms of eye/nose burning, itchy/watery eyes, sneezing, nasal congestion, PND, minimal rhinorrhea. Symptoms have been going on for 20+ years but worsening the past 2 years. The symptoms are present all year around with worsening in the fall and March. Other triggers include exposure to unknown but wearing masks make it worse. Anosmia: no. Headache: yes. She has used zyrtec, Claritin, Xyzal, allegra, Flonase with fair improvement in symptoms. Sinus infections: yes - usually 2-3 per year. Previous work up includes: none. Previous ENT evaluation: no. Previous sinus imaging: no. History of nasal polyps: no. Last eye exam: about 1+ year ago. History of reflux: sometimes. Moving to [place] in June.  Assessment and Plan: Darlene Odonnell is a 55 y.o. female with: Other allergic rhinitis Perennial rhinoconjunctivitis symptoms for the last 20+ years with worsening in the fall and spring.  Usually gets 2-3 sinus infections per year.  No prior allergy/ENT evaluation.  Tried Zyrtec, Claritin, Xyzal, Allegra and Flonase with some benefit.  Unable to skin test today due to recent antihistamine intake and patient does not think she can stop antihistamines for skin testing.  Get bloodwork and will make additional recommendations based on results.  . May use over the counter  antihistamines such as Zyrtec (cetirizine), Claritin (loratadine), Allegra (fexofenadine), or Xyzal (levocetirizine) daily as needed. May take twice a day if needed during flares. . Continue Singulair (montelukast) 10mg  daily at night. . May use Flonase (fluticasone) nasal spray 1 spray per nostril twice a day as needed for nasal congestion.  . May use azelastine nasal spray 1-2 sprays per nostril twice a day as needed for runny nose/drainage. . Nasal saline spray (i.e., Simply Saline) or nasal saline lavage (i.e., NeilMed) is recommended as needed and prior to medicated nasal sprays. Keep track of sinus infections.  . Patient moving to [place] in June.   Reactive airway disease Coughing, shortness of breath and wheezing with exertion and with bronchitis.  Takes albuterol as needed with good benefit.  Today's spirometry was normal.  May use albuterol rescue inhaler 2 puffs every 4 to 6 hours as needed for shortness of breath, chest tightness, coughing, and wheezing. May use albuterol rescue inhaler 2 puffs 5 to 15 minutes prior to strenuous physical activities. Monitor frequency of use.   Return in about 2 months (around 06/04/2021).  Meds ordered this encounter  Medications  . fluticasone (FLONASE) 50 MCG/ACT nasal spray    Sig: Place 1 spray into both nostrils 2 (two) times daily as needed (nasal congestion).    Dispense:  16 g    Refill:  5  . azelastine (ASTELIN) 0.1 % nasal spray    Sig: Place 1-2 sprays into both nostrils 2 (two) times daily as needed (runny nose, nasal drainage). Use in each nostril as directed    Dispense:  30 mL    Refill:  5    Lab Orders  Allergens w/Total IgE Area 2  Other allergy screening: Asthma: yes  Coughing and shortness of breath mainly with exertion - takes albuterol prn with good benefit. Wheezing during bronchitis.   Food allergy: no Medication allergy: no Hymenoptera allergy: large localized reactions Urticaria: in the past with no  known triggers Eczema:no History of recurrent infections suggestive of immunodeficency: no  Diagnostics: Spirometry:  Tracings reviewed. Her effort: Good reproducible efforts. FVC: 3.34L FEV1: 2.73L, 96% predicted FEV1/FVC ratio: 82% Interpretation: Spirometry consistent with normal pattern.  Please see scanned spirometry results for details.  Skin Testing: Unable to skin test today due to recent antihistamine intake.   Past Medical History: Patient Active Problem List   Diagnosis Date Noted  . Other allergic rhinitis 04/05/2021  . Reactive airway disease 04/05/2021  . Prediabetes   . Morbid obesity (HCC) 12/31/2020  . GAD (generalized anxiety disorder) 10/15/2019   Past Medical History:  Diagnosis Date  . Anxiety   . Prediabetes    Past Surgical History: Past Surgical History:  Procedure Laterality Date  . ADENOIDECTOMY    . CESAREAN SECTION  09/30/1993  . CESAREAN SECTION  07/23/1997  . TONSILLECTOMY     Medication List:  Current Outpatient Medications  Medication Sig Dispense Refill  . albuterol (VENTOLIN HFA) 108 (90 Base) MCG/ACT inhaler Inhale 2 puffs into the lungs every 6 (six) hours as needed for wheezing or shortness of breath. 18 g 2  . azelastine (ASTELIN) 0.1 % nasal spray Place 1-2 sprays into both nostrils 2 (two) times daily as needed (runny nose, nasal drainage). Use in each nostril as directed 30 mL 5  . fluticasone (FLONASE) 50 MCG/ACT nasal spray Place 1 spray into both nostrils 2 (two) times daily as needed (nasal congestion). 16 g 5  . levocetirizine (XYZAL) 5 MG tablet Take 5 mg by mouth every evening.    [place] levonorgestrel (MIRENA, 52 MG,) 20 MCG/24HR IUD by Intrauterine route.    . montelukast (SINGULAIR) 10 MG tablet Take 1 tablet (10 mg total) by mouth at bedtime. 30 tablet 3  . venlafaxine XR (EFFEXOR-XR) 75 MG 24 hr capsule Take 3 capsules (225 mg total) by mouth daily with breakfast. 270 capsule 2   No current facility-administered  medications for this visit.   Allergies: Allergies  Allergen Reactions  . Other     Other reaction(s): Unknown   Social History: Social History   Socioeconomic History  . Marital status: Married    Spouse name: Not on file  . Number of children: Not on file  . Years of education: Not on file  . Highest education level: Not on file  Occupational History  . Not on file  Tobacco Use  . Smoking status: Never Smoker  . Smokeless tobacco: Never Used  Substance and Sexual Activity  . Alcohol use: Yes  . Drug use: Never  . Sexual activity: Yes  Other Topics Concern  . Not on file  Social History Narrative  . Not on file   Social Determinants of Health   Financial Resource Strain: Not on file  Food Insecurity: Not on file  Transportation Needs: Not on file  Physical Activity: Not on file  Stress: Not on file  Social Connections: Not on file   Lives in a 53 year old house. Smoking: denies Occupation: Press photographer HistorySurveyor, minerals in the house: no Carpet in the family room: yes Carpet in the bedroom: yes Heating: gas Cooling: central Pet: yes 4 dogs x 7 yrs  Family History: Family History  Problem Relation Age of Onset  . Diabetes Mother   . Hypertension Mother   . Diabetes Father    Problem                               Relation Asthma                                   No  Eczema                                No  Food allergy                          No  Allergic rhino conjunctivitis     No   Review of Systems  Constitutional: Negative for appetite change, chills, fever and unexpected weight change.  HENT: Positive for congestion, postnasal drip, rhinorrhea and sneezing.   Eyes: Positive for itching.  Respiratory: Negative for cough, chest tightness, shortness of breath and wheezing.   Cardiovascular: Negative for chest pain.  Gastrointestinal: Negative for abdominal pain.  Genitourinary: Negative for difficulty  urinating.  Skin: Negative for rash.  Neurological: Positive for headaches.   Objective: BP 118/82   Pulse 91   Temp 97.7 F (36.5 C)   Resp 18   Ht 5' 5.5" (1.664 m)   Wt 255 lb 6.4 oz (115.8 kg)   SpO2 96%   BMI 41.85 kg/m  Body mass index is 41.85 kg/m. Physical Exam Vitals and nursing note reviewed.  Constitutional:      Appearance: Normal appearance. She is well-developed.  HENT:     Head: Normocephalic and atraumatic.     Right Ear: Tympanic membrane and external ear normal.     Left Ear: Tympanic membrane and external ear normal.     Nose: Nose normal.     Mouth/Throat:     Mouth: Mucous membranes are moist.     Pharynx: Oropharynx is clear.  Eyes:     Conjunctiva/sclera: Conjunctivae normal.  Cardiovascular:     Rate and Rhythm: Normal rate and regular rhythm.     Heart sounds: Normal heart sounds. No murmur heard. No friction rub. No gallop.   Pulmonary:     Effort: Pulmonary effort is normal.     Breath sounds: Normal breath sounds. No wheezing, rhonchi or rales.  Musculoskeletal:     Cervical back: Neck supple.  Skin:    General: Skin is warm.     Findings: No rash.  Neurological:     Mental Status: She is alert and oriented to person, place, and time.  Psychiatric:        Behavior: Behavior normal.    The plan was reviewed with the patient/family, and all questions/concerned were addressed.  It was my pleasure to see Darlene Odonnell today and participate in her care. Please feel free to contact me with any questions or concerns.  Sincerely,  Wyline Mood, DO Allergy & Immunology  [facility] office: [phone] [facility] office: [phone]

## 2021-04-05 DIAGNOSIS — J45909 Unspecified asthma, uncomplicated: Secondary | ICD-10-CM | POA: Insufficient documentation

## 2021-04-05 DIAGNOSIS — J3089 Other allergic rhinitis: Secondary | ICD-10-CM | POA: Insufficient documentation

## 2021-04-05 NOTE — Assessment & Plan Note (Signed)
Perennial rhinoconjunctivitis symptoms for the last 20+ years with worsening in the fall and spring.  Usually gets 2-3 sinus infections per year.  No prior allergy/ENT evaluation.  Tried Zyrtec, Claritin, Xyzal, Allegra and Flonase with some benefit.  Unable to skin test today due to recent antihistamine intake and patient does not think she can stop antihistamines for skin testing.  Get bloodwork and will make additional recommendations based on results.  . May use over the counter antihistamines such as Zyrtec (cetirizine), Claritin (loratadine), Allegra (fexofenadine), or Xyzal (levocetirizine) daily as needed. May take twice a day if needed during flares. . Continue Singulair (montelukast) 10mg  daily at night. . May use Flonase (fluticasone) nasal spray 1 spray per nostril twice a day as needed for nasal congestion.  . May use azelastine nasal spray 1-2 sprays per nostril twice a day as needed for runny nose/drainage. . Nasal saline spray (i.e., Simply Saline) or nasal saline lavage (i.e., NeilMed) is recommended as needed and prior to medicated nasal sprays. Keep track of sinus infections.  . Patient moving to [place] in June.

## 2021-04-05 NOTE — Assessment & Plan Note (Signed)
Coughing, shortness of breath and wheezing with exertion and with bronchitis.  Takes albuterol as needed with good benefit.  Today's spirometry was normal.  May use albuterol rescue inhaler 2 puffs every 4 to 6 hours as needed for shortness of breath, chest tightness, coughing, and wheezing. May use albuterol rescue inhaler 2 puffs 5 to 15 minutes prior to strenuous physical activities. Monitor frequency of use.

## 2021-04-11 LAB — ALLERGENS W/TOTAL IGE AREA 2
Alternaria Alternata IgE: <0.1 kU/L
Aspergillus Fumigatus IgE: <0.1 kU/L
Bermuda Grass IgE: <0.1 kU/L
Cat Dander IgE: 0.14 kU/L — AB
Cedar, Mountain IgE: <0.1 kU/L
Cladosporium Herbarum IgE: <0.1 kU/L
Cockroach, German IgE: <0.1 kU/L
Common Silver Birch IgE: 0.67 kU/L — AB
Cottonwood IgE: <0.1 kU/L
D Farinae IgE: <0.1 kU/L
D Pteronyssinus IgE: <0.1 kU/L
Dog Dander IgE: 1.91 kU/L — AB
Elm, American IgE: <0.1 kU/L
IgE (Immunoglobulin E), Serum: 51 IU/mL (ref 6–495)
Johnson Grass IgE: <0.1 kU/L
Maple/Box Elder IgE: <0.1 kU/L
Mouse Urine IgE: <0.1 kU/L
Oak, White IgE: <0.1 kU/L
Pecan, Hickory IgE: <0.1 kU/L
Penicillium Chrysogen IgE: <0.1 kU/L
Pigweed, Rough IgE: <0.1 kU/L
Ragweed, Short IgE: <0.1 kU/L
Sheep Sorrel IgE Qn: <0.1 kU/L
Timothy Grass IgE: <0.1 kU/L
White Mulberry IgE: <0.1 kU/L

## 2021-04-27 ENCOUNTER — Ambulatory Visit: Payer: BC Managed Care – PPO | Admitting: Family Medicine

## 2021-05-25 DIAGNOSIS — Z021 Encounter for pre-employment examination: Secondary | ICD-10-CM | POA: Diagnosis not present

## 2021-07-29 DIAGNOSIS — R030 Elevated blood-pressure reading, without diagnosis of hypertension: Secondary | ICD-10-CM | POA: Diagnosis not present

## 2021-07-29 DIAGNOSIS — R7303 Prediabetes: Secondary | ICD-10-CM | POA: Diagnosis not present

## 2021-07-29 DIAGNOSIS — H04123 Dry eye syndrome of bilateral lacrimal glands: Secondary | ICD-10-CM | POA: Diagnosis not present

## 2021-07-29 DIAGNOSIS — H6592 Unspecified nonsuppurative otitis media, left ear: Secondary | ICD-10-CM | POA: Diagnosis not present

## 2021-07-29 DIAGNOSIS — J302 Other seasonal allergic rhinitis: Secondary | ICD-10-CM | POA: Diagnosis not present

## 2021-07-29 DIAGNOSIS — R5383 Other fatigue: Secondary | ICD-10-CM | POA: Diagnosis not present

## 2021-08-16 DIAGNOSIS — H04123 Dry eye syndrome of bilateral lacrimal glands: Secondary | ICD-10-CM | POA: Diagnosis not present

## 2021-10-11 DIAGNOSIS — R591 Generalized enlarged lymph nodes: Secondary | ICD-10-CM | POA: Diagnosis not present

## 2021-10-11 DIAGNOSIS — J069 Acute upper respiratory infection, unspecified: Secondary | ICD-10-CM | POA: Diagnosis not present

## 2021-10-11 DIAGNOSIS — R509 Fever, unspecified: Secondary | ICD-10-CM | POA: Diagnosis not present

## 2021-10-11 DIAGNOSIS — R059 Cough, unspecified: Secondary | ICD-10-CM | POA: Diagnosis not present

## 2021-10-16 ENCOUNTER — Other Ambulatory Visit: Payer: Self-pay | Admitting: Family Medicine

## 2021-10-16 DIAGNOSIS — F411 Generalized anxiety disorder: Secondary | ICD-10-CM

## 2021-10-18 DIAGNOSIS — R051 Acute cough: Secondary | ICD-10-CM | POA: Diagnosis not present

## 2021-10-18 DIAGNOSIS — R062 Wheezing: Secondary | ICD-10-CM | POA: Diagnosis not present

## 2021-10-18 DIAGNOSIS — J40 Bronchitis, not specified as acute or chronic: Secondary | ICD-10-CM | POA: Diagnosis not present

## 2021-10-31 DIAGNOSIS — R682 Dry mouth, unspecified: Secondary | ICD-10-CM | POA: Diagnosis not present

## 2021-10-31 DIAGNOSIS — R053 Chronic cough: Secondary | ICD-10-CM | POA: Diagnosis not present

## 2021-10-31 DIAGNOSIS — H04123 Dry eye syndrome of bilateral lacrimal glands: Secondary | ICD-10-CM | POA: Diagnosis not present

## 2021-11-11 DIAGNOSIS — J209 Acute bronchitis, unspecified: Secondary | ICD-10-CM | POA: Diagnosis not present

## 2022-01-19 DIAGNOSIS — B9689 Other specified bacterial agents as the cause of diseases classified elsewhere: Secondary | ICD-10-CM | POA: Diagnosis not present

## 2022-01-19 DIAGNOSIS — H60333 Swimmer's ear, bilateral: Secondary | ICD-10-CM | POA: Diagnosis not present

## 2022-01-19 DIAGNOSIS — R053 Chronic cough: Secondary | ICD-10-CM | POA: Diagnosis not present

## 2022-01-19 DIAGNOSIS — J019 Acute sinusitis, unspecified: Secondary | ICD-10-CM | POA: Diagnosis not present

## 2022-02-07 DIAGNOSIS — R059 Cough, unspecified: Secondary | ICD-10-CM | POA: Diagnosis not present

## 2022-02-07 DIAGNOSIS — G501 Atypical facial pain: Secondary | ICD-10-CM | POA: Diagnosis not present

## 2022-02-07 DIAGNOSIS — R0982 Postnasal drip: Secondary | ICD-10-CM | POA: Diagnosis not present

## 2022-02-07 DIAGNOSIS — J309 Allergic rhinitis, unspecified: Secondary | ICD-10-CM | POA: Diagnosis not present

## 2022-04-10 DIAGNOSIS — Z0000 Encounter for general adult medical examination without abnormal findings: Secondary | ICD-10-CM | POA: Diagnosis not present

## 2022-04-10 DIAGNOSIS — R059 Cough, unspecified: Secondary | ICD-10-CM | POA: Diagnosis not present

## 2022-04-11 DIAGNOSIS — G4710 Hypersomnia, unspecified: Secondary | ICD-10-CM | POA: Diagnosis not present

## 2022-04-11 DIAGNOSIS — R053 Chronic cough: Secondary | ICD-10-CM | POA: Diagnosis not present

## 2022-04-11 DIAGNOSIS — R058 Other specified cough: Secondary | ICD-10-CM | POA: Diagnosis not present

## 2022-04-11 DIAGNOSIS — R0683 Snoring: Secondary | ICD-10-CM | POA: Diagnosis not present

## 2022-04-24 DIAGNOSIS — Z01411 Encounter for gynecological examination (general) (routine) with abnormal findings: Secondary | ICD-10-CM | POA: Diagnosis not present

## 2022-04-24 DIAGNOSIS — T8332XA Displacement of intrauterine contraceptive device, initial encounter: Secondary | ICD-10-CM | POA: Diagnosis not present

## 2022-04-24 DIAGNOSIS — N951 Menopausal and female climacteric states: Secondary | ICD-10-CM | POA: Diagnosis not present

## 2022-04-28 DIAGNOSIS — Z1231 Encounter for screening mammogram for malignant neoplasm of breast: Secondary | ICD-10-CM | POA: Diagnosis not present

## 2022-05-16 DIAGNOSIS — G4710 Hypersomnia, unspecified: Secondary | ICD-10-CM | POA: Diagnosis not present

## 2022-06-01 IMAGING — MG MM DIGITAL SCREENING BILAT W/ TOMO AND CAD
8 series · 9 of 24 positions shown · non-contrast
Comparison: Previous exam(s).

CLINICAL DATA: Screening.

EXAM:
DIGITAL SCREENING BILATERAL MAMMOGRAM WITH TOMOSYNTHESIS AND CAD
TECHNIQUE: Bilateral screening digital craniocaudal and mediolateral oblique
mammograms were obtained. Bilateral screening digital breast
tomosynthesis was performed. The images were evaluated with
computer-aided detection.

[L MLO synth-2D]
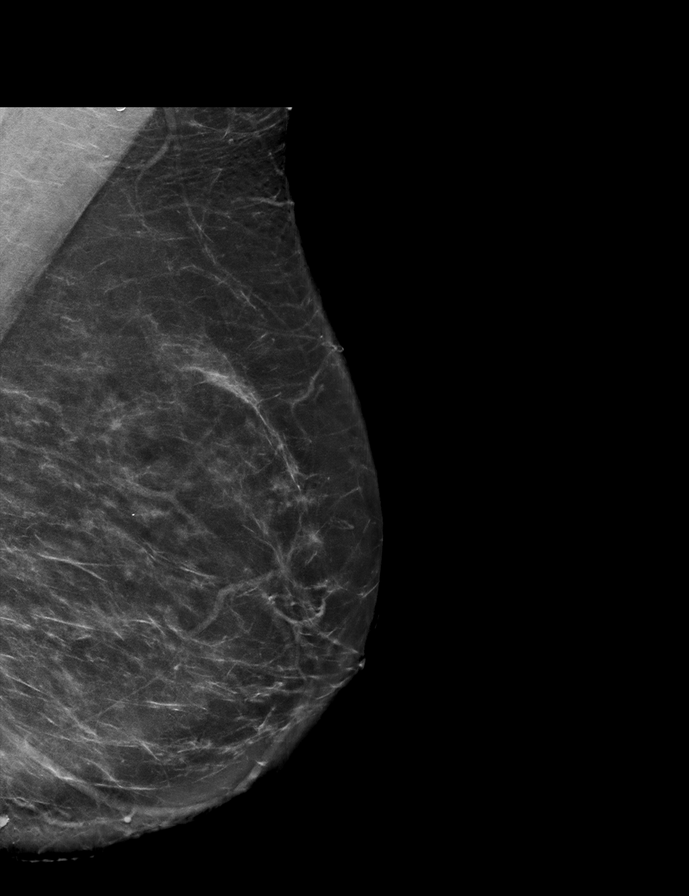

[R MLO synth-2D]
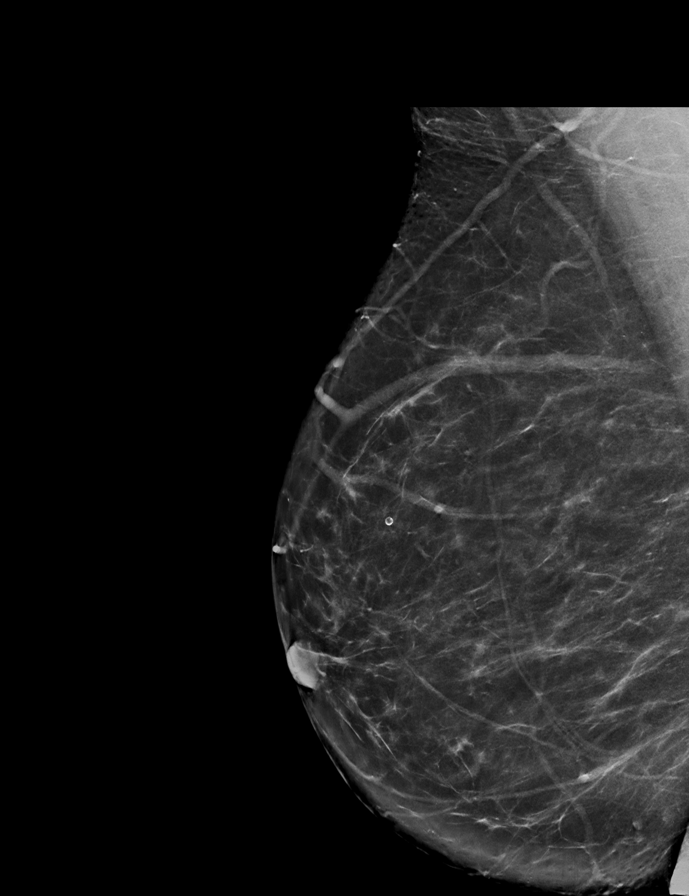

[L CC synth-2D]
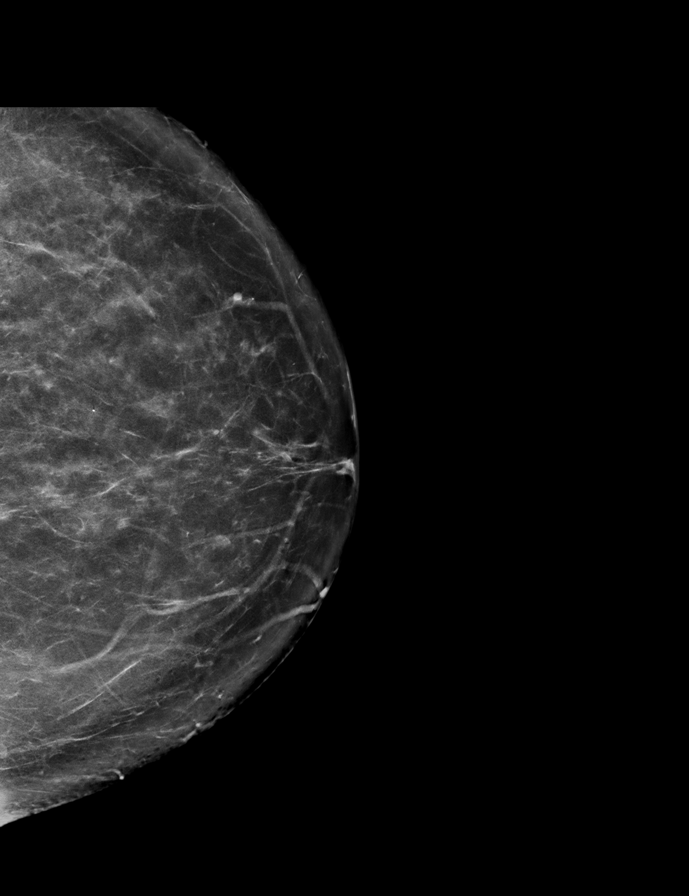

[R CC synth-2D]
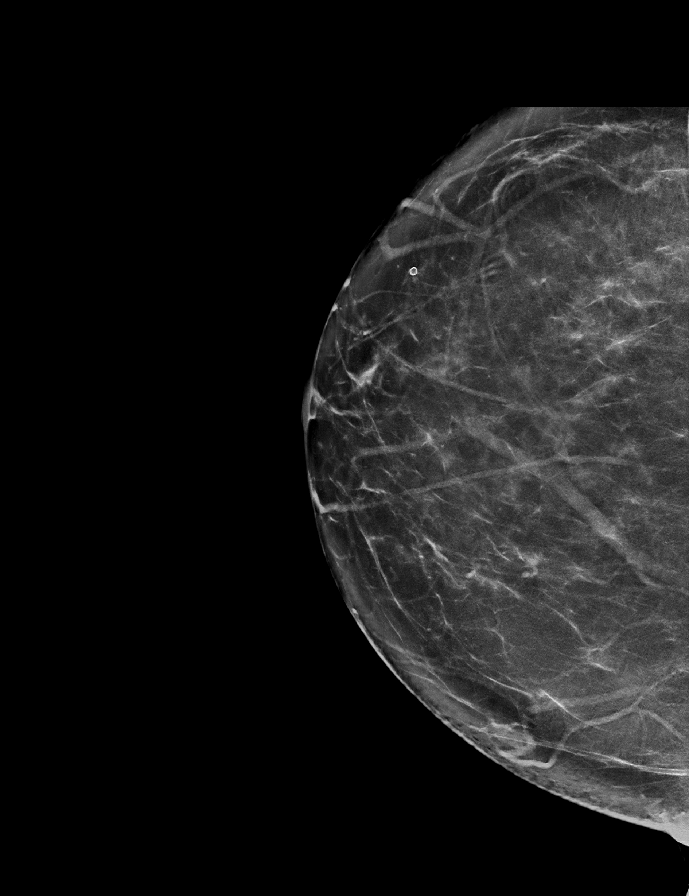

[R MLO tomo · 2 of 85 frames shown]
[frame 28/85]
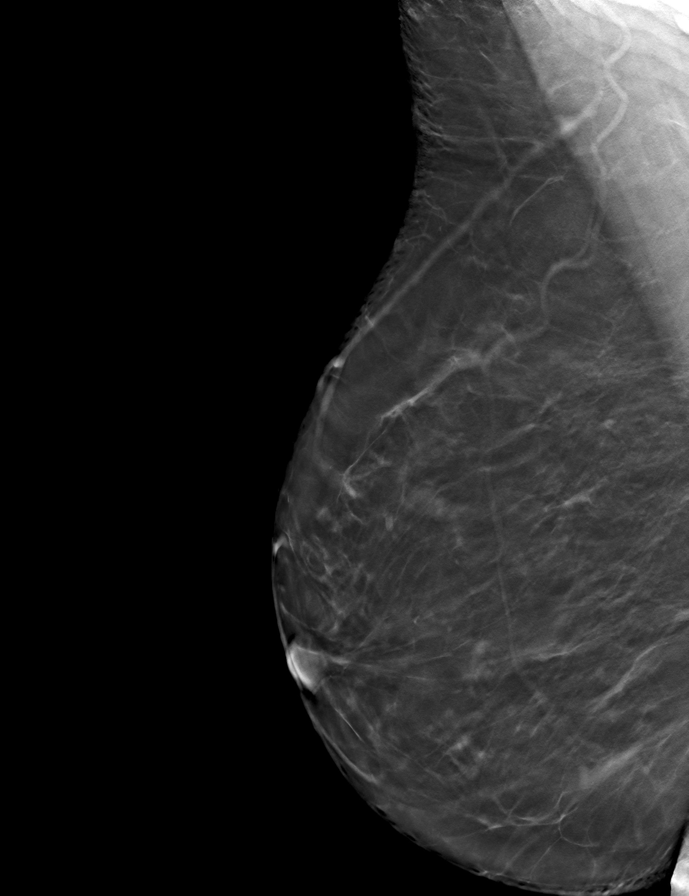
[frame 43/85]
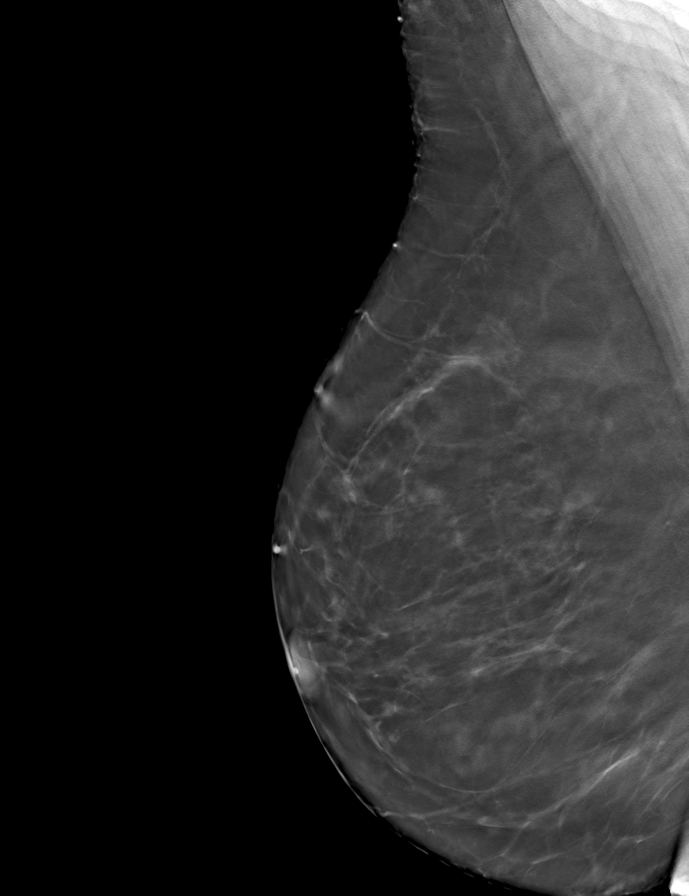

[L CC tomo · tomo slice 43/85.0]
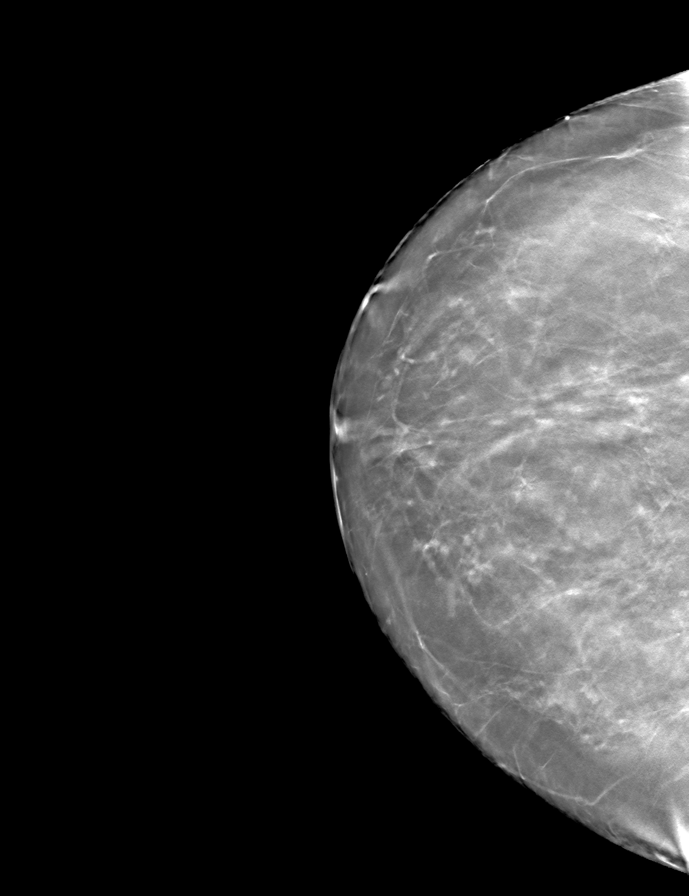

[R CC tomo · tomo slice 41/82.0]
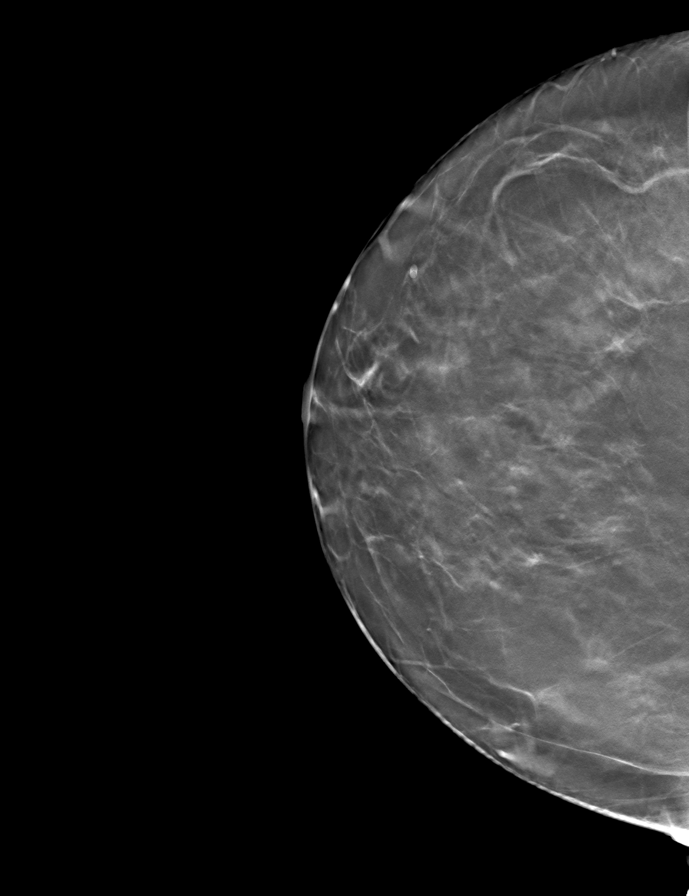

[L MLO tomo · tomo slice 44/87.0]
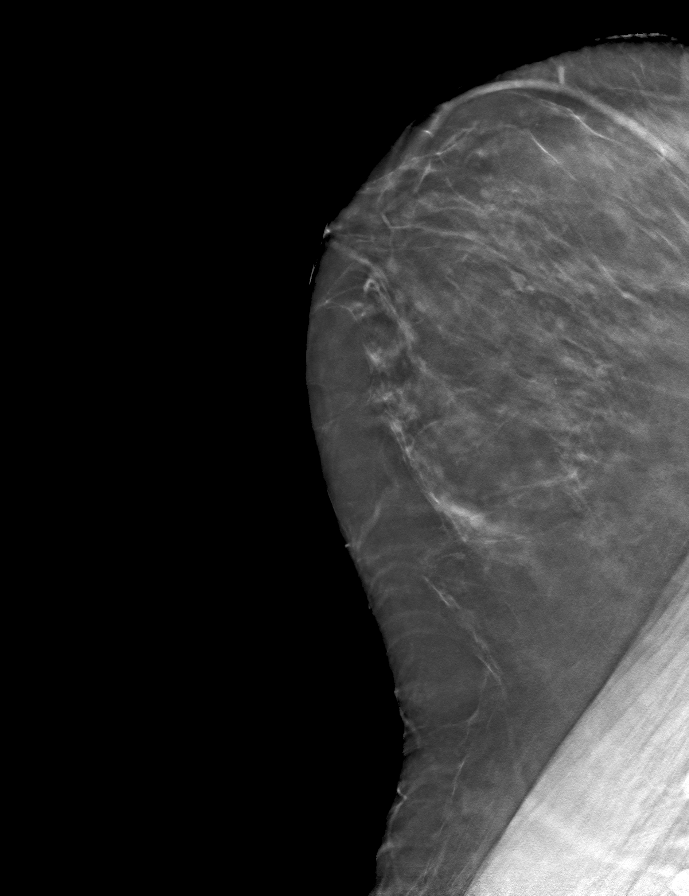

[9 of 24 positions shown; findings below may reference images not displayed]

ACR Breast Density Category b: There are scattered areas of
fibroglandular density.
FINDINGS: There are no findings suspicious for malignancy. The images were
evaluated with computer-aided detection.
IMPRESSION: No mammographic evidence of malignancy. A result letter of this
screening mammogram will be mailed directly to the patient.

RECOMMENDATION:
Screening mammogram in one year. (Code:[id number])

BI-RADS CATEGORY  1: Negative.

## 2022-06-08 DIAGNOSIS — G4733 Obstructive sleep apnea (adult) (pediatric): Secondary | ICD-10-CM | POA: Diagnosis not present

## 2022-06-19 DIAGNOSIS — Z111 Encounter for screening for respiratory tuberculosis: Secondary | ICD-10-CM | POA: Diagnosis not present

## 2022-07-18 ENCOUNTER — Other Ambulatory Visit: Payer: Self-pay | Admitting: Family Medicine

## 2022-07-18 DIAGNOSIS — F411 Generalized anxiety disorder: Secondary | ICD-10-CM

## 2022-07-18 NOTE — Telephone Encounter (Signed)
Last OV with PCP was on 02/11/2021 Message sent to the patient on 02/15/2022 to schedule appt with PCP. No response Called the patient today (07/18/22) to call our office//needs to schedule an appointment with PCP
